# Patient Record
Sex: Female | Born: 1980 | Race: White | Hispanic: No | Marital: Married | Smoking: Former smoker
Health system: Southern US, Community
[De-identification: ages and names within clinical notes are randomized; demographics above are authoritative.]

## PROBLEM LIST (undated history)

## (undated) DIAGNOSIS — Z86018 Personal history of other benign neoplasm: Secondary | ICD-10-CM

## (undated) DIAGNOSIS — Z8719 Personal history of other diseases of the digestive system: Secondary | ICD-10-CM

## (undated) DIAGNOSIS — I471 Supraventricular tachycardia, unspecified: Secondary | ICD-10-CM

## (undated) DIAGNOSIS — Z8619 Personal history of other infectious and parasitic diseases: Secondary | ICD-10-CM

## (undated) DIAGNOSIS — F419 Anxiety disorder, unspecified: Secondary | ICD-10-CM

## (undated) DIAGNOSIS — Z8679 Personal history of other diseases of the circulatory system: Secondary | ICD-10-CM

## (undated) DIAGNOSIS — Z8639 Personal history of other endocrine, nutritional and metabolic disease: Secondary | ICD-10-CM

## (undated) DIAGNOSIS — Z8709 Personal history of other diseases of the respiratory system: Secondary | ICD-10-CM

## (undated) DIAGNOSIS — E041 Nontoxic single thyroid nodule: Secondary | ICD-10-CM

## (undated) HISTORY — DX: Supraventricular tachycardia: I47.1

## (undated) HISTORY — DX: Personal history of other endocrine, nutritional and metabolic disease: Z86.39

## (undated) HISTORY — PX: CARDIAC ELECTROPHYSIOLOGY STUDY AND ABLATION: SHX1294

## (undated) HISTORY — PX: WISDOM TOOTH EXTRACTION: SHX21

## (undated) HISTORY — DX: Nontoxic single thyroid nodule: E04.1

## (undated) HISTORY — DX: Personal history of other diseases of the respiratory system: Z87.09

## (undated) HISTORY — DX: Personal history of other diseases of the circulatory system: Z86.79

## (undated) HISTORY — PX: TONSILLECTOMY AND ADENOIDECTOMY: SUR1326

## (undated) HISTORY — DX: Anxiety disorder, unspecified: F41.9

## (undated) HISTORY — DX: Supraventricular tachycardia, unspecified: I47.10

## (undated) HISTORY — DX: Personal history of other infectious and parasitic diseases: Z86.19

## (undated) HISTORY — DX: Personal history of other diseases of the digestive system: Z87.19

---
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
## 2001-05-24 ENCOUNTER — Emergency Department (HOSPITAL_COMMUNITY): Admission: EM | Admit: 2001-05-24 | Discharge: 2001-05-24 | Payer: Self-pay | Admitting: Emergency Medicine

## 2001-08-01 ENCOUNTER — Emergency Department (HOSPITAL_COMMUNITY): Admission: EM | Admit: 2001-08-01 | Discharge: 2001-08-01 | Payer: Self-pay | Admitting: Emergency Medicine

## 2004-04-11 ENCOUNTER — Other Ambulatory Visit: Admission: RE | Admit: 2004-04-11 | Discharge: 2004-04-11 | Payer: Self-pay | Admitting: Obstetrics and Gynecology

## 2005-04-16 ENCOUNTER — Other Ambulatory Visit: Admission: RE | Admit: 2005-04-16 | Discharge: 2005-04-16 | Payer: Self-pay | Admitting: Obstetrics and Gynecology

## 2010-10-12 ENCOUNTER — Other Ambulatory Visit: Payer: Self-pay | Admitting: Obstetrics and Gynecology

## 2010-10-12 ENCOUNTER — Other Ambulatory Visit (HOSPITAL_COMMUNITY)
Admission: RE | Admit: 2010-10-12 | Discharge: 2010-10-12 | Disposition: A | Discharge status: Home / Self Care | Payer: BC Managed Care – PPO | Source: Ambulatory Visit | Attending: Obstetrics and Gynecology | Admitting: Obstetrics and Gynecology

## 2010-10-12 DIAGNOSIS — Z1159 Encounter for screening for other viral diseases: Secondary | ICD-10-CM | POA: Insufficient documentation

## 2010-10-12 DIAGNOSIS — Z01419 Encounter for gynecological examination (general) (routine) without abnormal findings: Secondary | ICD-10-CM | POA: Insufficient documentation

## 2011-10-01 ENCOUNTER — Other Ambulatory Visit: Payer: Self-pay | Admitting: Obstetrics and Gynecology

## 2011-10-01 ENCOUNTER — Other Ambulatory Visit (HOSPITAL_COMMUNITY)
Admission: RE | Admit: 2011-10-01 | Discharge: 2011-10-01 | Disposition: A | Discharge status: Home / Self Care | Payer: BC Managed Care – PPO | Source: Ambulatory Visit | Attending: Obstetrics and Gynecology | Admitting: Obstetrics and Gynecology

## 2011-10-01 DIAGNOSIS — Z1151 Encounter for screening for human papillomavirus (HPV): Secondary | ICD-10-CM | POA: Insufficient documentation

## 2011-10-01 DIAGNOSIS — Z01419 Encounter for gynecological examination (general) (routine) without abnormal findings: Secondary | ICD-10-CM | POA: Insufficient documentation

## 2011-10-01 DIAGNOSIS — Z113 Encounter for screening for infections with a predominantly sexual mode of transmission: Secondary | ICD-10-CM | POA: Insufficient documentation

## 2011-10-02 ENCOUNTER — Other Ambulatory Visit: Payer: Self-pay | Admitting: Obstetrics and Gynecology

## 2011-10-02 DIAGNOSIS — E049 Nontoxic goiter, unspecified: Secondary | ICD-10-CM

## 2011-10-04 ENCOUNTER — Ambulatory Visit
Admission: RE | Admit: 2011-10-04 | Discharge: 2011-10-04 | Disposition: A | Discharge status: Home / Self Care | Payer: BC Managed Care – PPO | Source: Ambulatory Visit | Attending: Obstetrics and Gynecology | Admitting: Obstetrics and Gynecology

## 2011-10-04 ENCOUNTER — Other Ambulatory Visit: Payer: BC Managed Care – PPO

## 2011-10-04 DIAGNOSIS — E049 Nontoxic goiter, unspecified: Secondary | ICD-10-CM

## 2011-10-05 ENCOUNTER — Other Ambulatory Visit: Payer: Self-pay | Admitting: Obstetrics and Gynecology

## 2011-10-05 DIAGNOSIS — E041 Nontoxic single thyroid nodule: Secondary | ICD-10-CM

## 2011-10-09 ENCOUNTER — Ambulatory Visit
Admission: RE | Admit: 2011-10-09 | Discharge: 2011-10-09 | Disposition: A | Discharge status: Home / Self Care | Payer: BC Managed Care – PPO | Source: Ambulatory Visit | Attending: Obstetrics and Gynecology | Admitting: Obstetrics and Gynecology

## 2011-10-09 ENCOUNTER — Other Ambulatory Visit (HOSPITAL_COMMUNITY)
Admission: RE | Admit: 2011-10-09 | Discharge: 2011-10-09 | Disposition: A | Discharge status: Home / Self Care | Payer: BC Managed Care – PPO | Source: Ambulatory Visit | Attending: Interventional Radiology | Admitting: Interventional Radiology

## 2011-10-09 DIAGNOSIS — E049 Nontoxic goiter, unspecified: Secondary | ICD-10-CM | POA: Insufficient documentation

## 2011-10-09 DIAGNOSIS — E041 Nontoxic single thyroid nodule: Secondary | ICD-10-CM

## 2012-02-11 ENCOUNTER — Other Ambulatory Visit: Payer: Self-pay | Admitting: Obstetrics and Gynecology

## 2012-02-11 DIAGNOSIS — Z8639 Personal history of other endocrine, nutritional and metabolic disease: Secondary | ICD-10-CM

## 2012-02-13 ENCOUNTER — Other Ambulatory Visit: Payer: BC Managed Care – PPO

## 2012-02-26 ENCOUNTER — Ambulatory Visit
Admission: RE | Admit: 2012-02-26 | Discharge: 2012-02-26 | Disposition: A | Discharge status: Home / Self Care | Payer: BC Managed Care – PPO | Source: Ambulatory Visit | Attending: Obstetrics and Gynecology | Admitting: Obstetrics and Gynecology

## 2012-02-26 DIAGNOSIS — Z8639 Personal history of other endocrine, nutritional and metabolic disease: Secondary | ICD-10-CM

## 2013-02-18 ENCOUNTER — Other Ambulatory Visit: Payer: Self-pay | Admitting: Obstetrics and Gynecology

## 2013-02-18 DIAGNOSIS — E041 Nontoxic single thyroid nodule: Secondary | ICD-10-CM

## 2013-02-20 ENCOUNTER — Ambulatory Visit
Admission: RE | Admit: 2013-02-20 | Discharge: 2013-02-20 | Disposition: A | Discharge status: Home / Self Care | Payer: BC Managed Care – PPO | Source: Ambulatory Visit | Attending: Obstetrics and Gynecology | Admitting: Obstetrics and Gynecology

## 2013-02-20 DIAGNOSIS — E041 Nontoxic single thyroid nodule: Secondary | ICD-10-CM

## 2013-06-29 IMAGING — US US THYROID BIOPSY
1 series · 12 of 12 positions shown · non-contrast
Comparison: none

INDICATION: Indeterminate left-sided thyroid nodule

[Series 1: us thyroid biopsy · 0.05mm/px · 12 acquisitions, 12 frames shown]
[im 1/12]
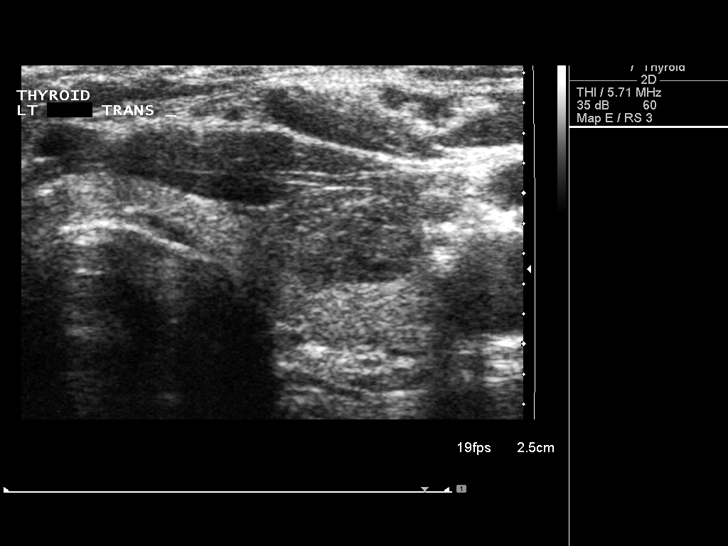
[im 2/12]
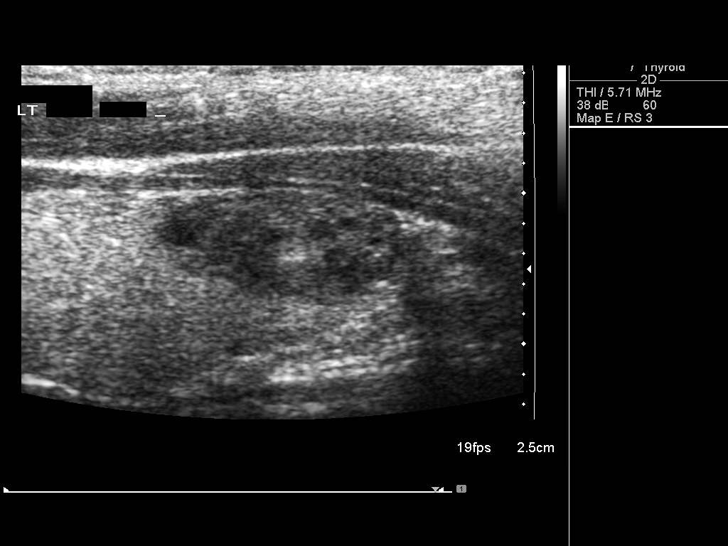
[im 3/12]
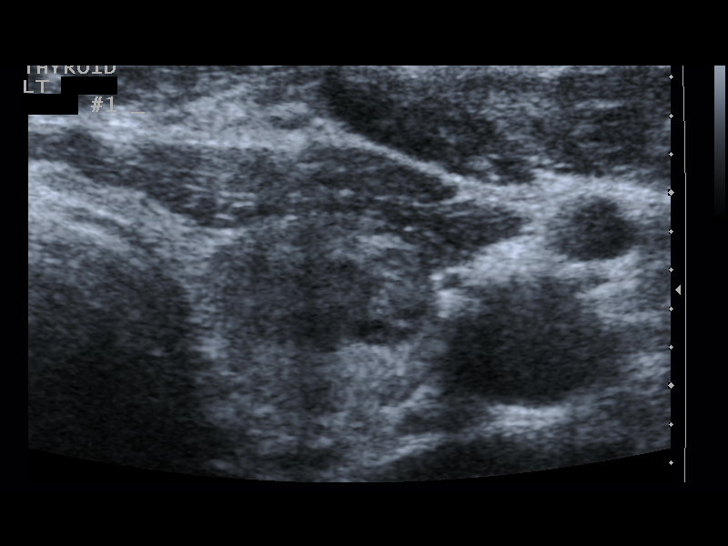
[im 4/12]
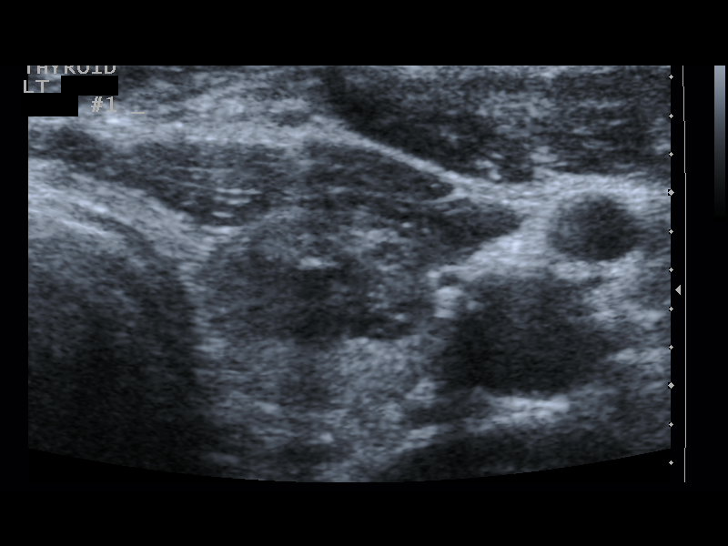
[im 5/12]
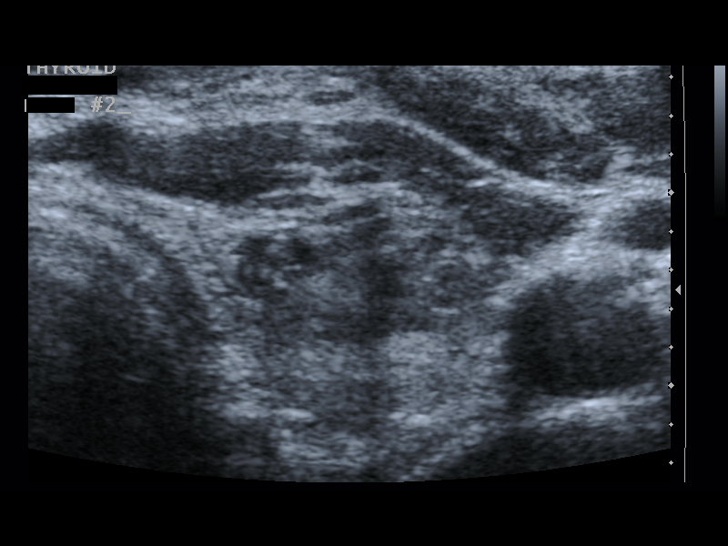
[im 6/12]
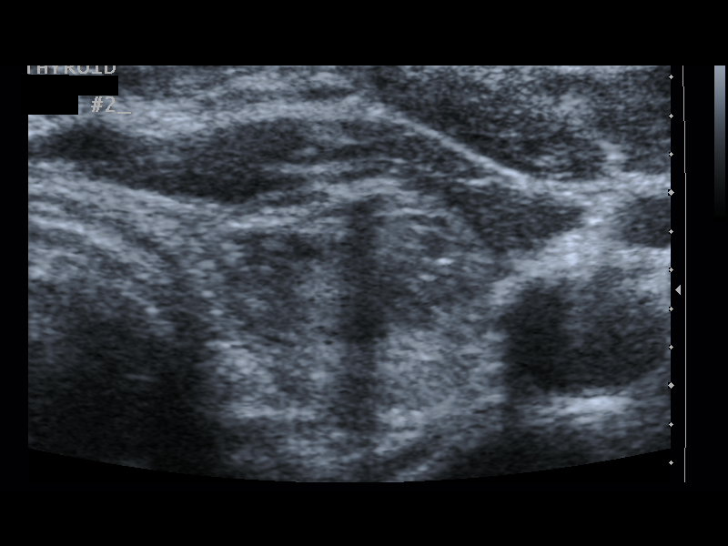
[im 7/12]
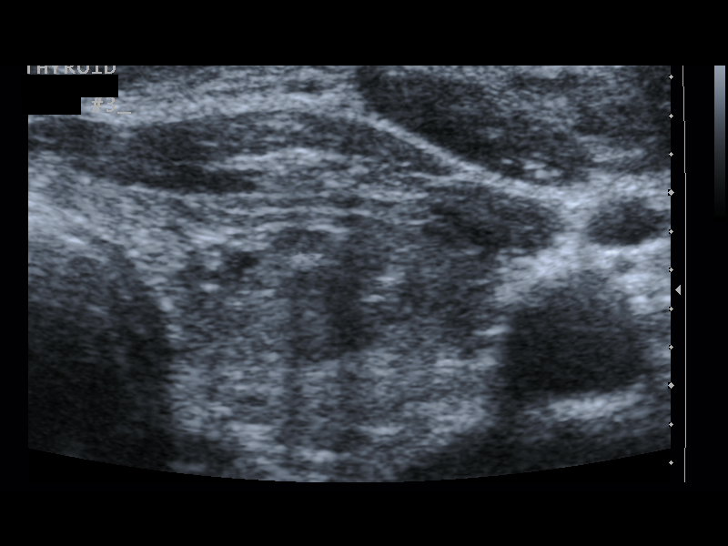
[im 8/12]
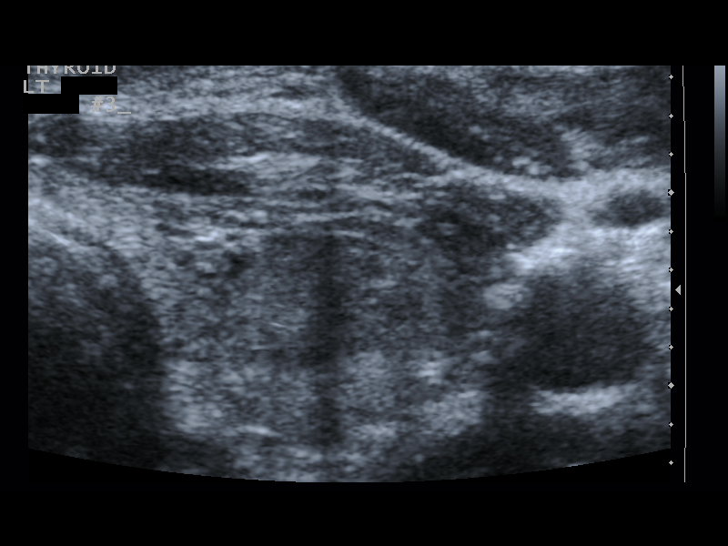
[im 9/12]
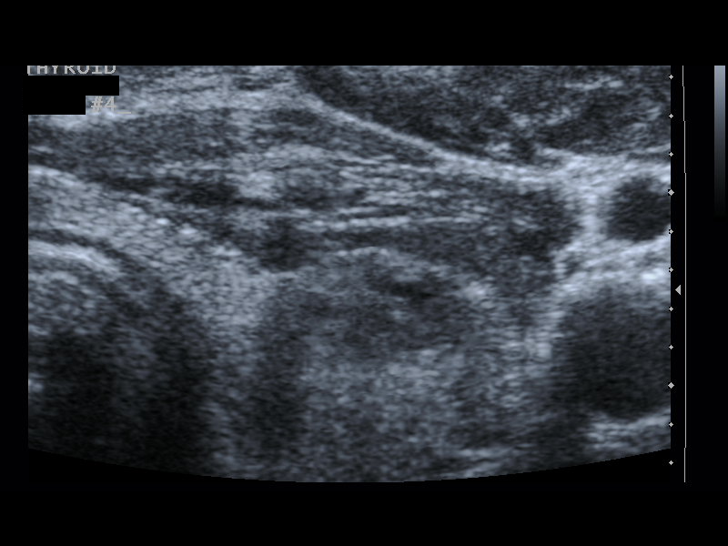
[im 10/12]
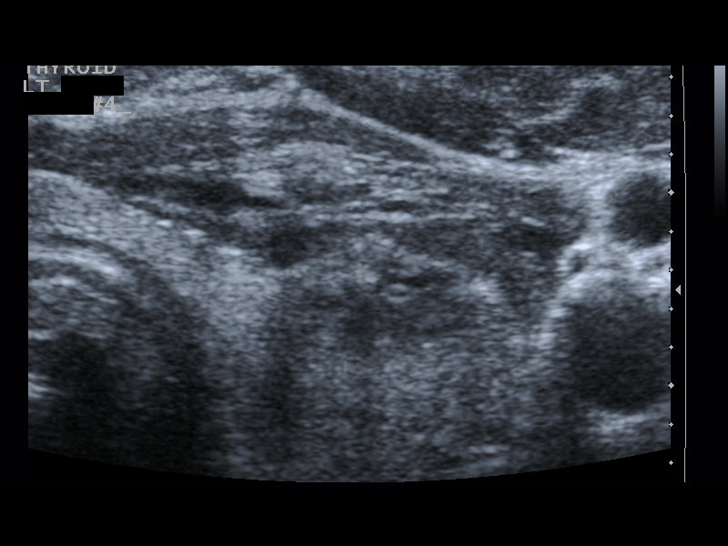
[im 11/12]
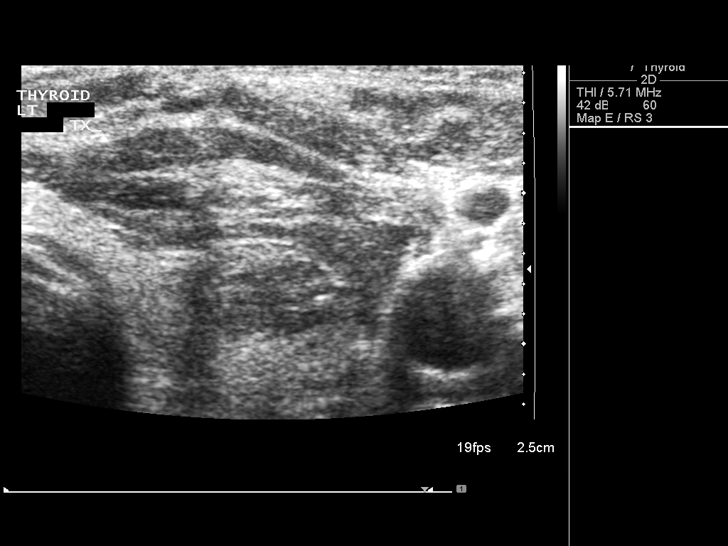
[im 12/12]
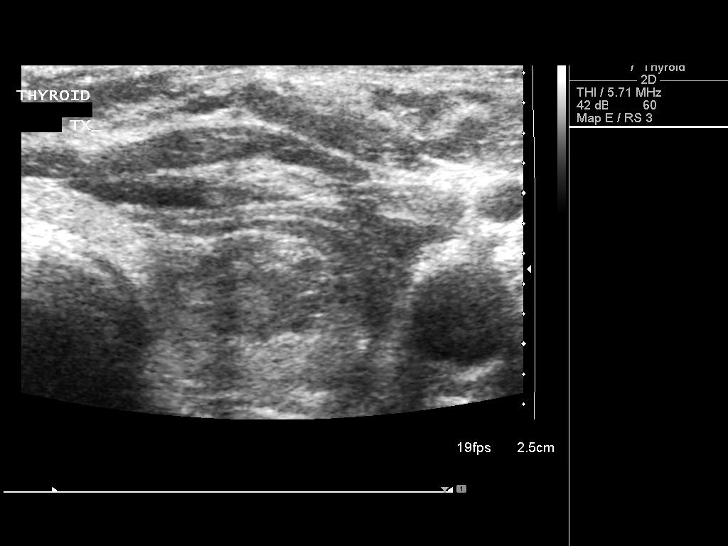

[12 of 12 positions shown; findings below may reference images not displayed]

ULTRASOUND GUIDED THYROID FINE NEEDLE ASPIRATION

Comparisons: Thyroid Ultrasound - 10/04/2011

Intravenous Medications: None

Complications: None immediate

Technique / Findings:

Informed written consent was obtained from the patient after a
discussion of the risks, benefits and alternatives to treatment.
Questions regarding the procedure were encouraged and answered.  A
timeout was performed prior to the initiation of the procedure.

Pre-procedural ultrasound scanning demonstrated grossly unchanged
appearance of primarily hypoechoic approximately 1.6 cm nodule
within the inferior aspect of the left lobe of the thyroid  The
procedure was planned.  The neck was prepped in the usual sterile
fashion, and a sterile drape was applied covering the operative
field.  A timeout was performed prior to the initiation of the
procedure.  Local anesthesia was provided with 1% lidocaine.

Under direct ultrasound guidance, 4 FNA biopsies were performed of
nodule with a 25 gauge needle.  The samples were prepared and
submitted to pathology.  Limited post procedural scanning was
negative for hematoma or additional complication.  A dressing was
placed.  The patient tolerated procedure well without immediate
postprocedural complication.
IMPRESSION: Technically successful ultrasound guided fine needle aspiration of
indeterminate nodule within the inferior aspect of the left lobe of
the thyroid

## 2013-10-15 ENCOUNTER — Other Ambulatory Visit: Payer: Self-pay | Admitting: Obstetrics and Gynecology

## 2013-10-15 ENCOUNTER — Other Ambulatory Visit (HOSPITAL_COMMUNITY)
Admission: RE | Admit: 2013-10-15 | Discharge: 2013-10-15 | Disposition: A | Discharge status: Home / Self Care | Payer: BC Managed Care – PPO | Source: Ambulatory Visit | Attending: Obstetrics and Gynecology | Admitting: Obstetrics and Gynecology

## 2013-10-15 DIAGNOSIS — Z1151 Encounter for screening for human papillomavirus (HPV): Secondary | ICD-10-CM | POA: Insufficient documentation

## 2013-10-15 DIAGNOSIS — Z01419 Encounter for gynecological examination (general) (routine) without abnormal findings: Secondary | ICD-10-CM | POA: Insufficient documentation

## 2013-10-20 LAB — CYTOLOGY - PAP

## 2013-11-30 ENCOUNTER — Encounter: Payer: Self-pay | Admitting: Family Medicine

## 2013-11-30 ENCOUNTER — Encounter (INDEPENDENT_AMBULATORY_CARE_PROVIDER_SITE_OTHER): Payer: Self-pay

## 2013-11-30 ENCOUNTER — Ambulatory Visit (INDEPENDENT_AMBULATORY_CARE_PROVIDER_SITE_OTHER): Payer: BC Managed Care – PPO | Admitting: Family Medicine

## 2013-11-30 VITALS — BP 122/66 | HR 70 | Temp 98.8°F | Ht 65.0 in | Wt 148.0 lb

## 2013-11-30 DIAGNOSIS — E041 Nontoxic single thyroid nodule: Secondary | ICD-10-CM

## 2013-11-30 DIAGNOSIS — K219 Gastro-esophageal reflux disease without esophagitis: Secondary | ICD-10-CM

## 2013-11-30 DIAGNOSIS — R590 Localized enlarged lymph nodes: Secondary | ICD-10-CM | POA: Insufficient documentation

## 2013-11-30 DIAGNOSIS — I471 Supraventricular tachycardia: Secondary | ICD-10-CM | POA: Insufficient documentation

## 2013-11-30 DIAGNOSIS — Z91030 Bee allergy status: Secondary | ICD-10-CM | POA: Insufficient documentation

## 2013-11-30 DIAGNOSIS — J302 Other seasonal allergic rhinitis: Secondary | ICD-10-CM | POA: Insufficient documentation

## 2013-11-30 MED ORDER — EPINEPHRINE 0.3 MG/0.3ML IJ SOAJ: 0.3000 mg | Freq: Once | INTRAMUSCULAR | Status: DC

## 2013-11-30 NOTE — Assessment & Plan Note (Signed)
Palpable small LN L submandibular Unsure cause -will watch this  If not down/ nl in 1-2 weeks pt will update   No other adenopathy

## 2013-11-30 NOTE — Patient Instructions (Signed)
Please stop up front to request records  Take care of yourself  In allergy season -try claritin and flonase nasal spray

## 2013-11-30 NOTE — Assessment & Plan Note (Signed)
Stable by recent thyroid US Neg bx in the past  Will continue to watch it

## 2013-11-30 NOTE — Assessment & Plan Note (Signed)
Given epi pen px

## 2013-11-30 NOTE — Assessment & Plan Note (Signed)
Resolved s/p ablation  Disc avoidance of caffeine  No M heard on exam today

## 2013-11-30 NOTE — Progress Notes (Signed)
Pre visit review using our clinic review tool, if applicable. No additional management support is needed unless otherwise documented below in the visit note. 

## 2013-11-30 NOTE — Assessment & Plan Note (Signed)
Springtime symptoms - suspect tree pollen allergies Will try clartin daily and flonase ns as needed during season Nl exam today

## 2013-11-30 NOTE — Assessment & Plan Note (Signed)
This does not bother her if she eats properly /avoiding spicy foods and does not eat late at night  Disc lifestyle change and diet for GERD  If symptoms develop regularly -would discuss daily tx  Will use H2 blocker prn for now

## 2013-11-30 NOTE — Progress Notes (Signed)
Subjective:    Patient ID: Donna FasterJustine Contreras, female    DOB: 1980-10-30, 33 y.o.   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 Whether she had a murmur  She does have to watch caffeine or heart will "flutter"   ? 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  Past Medical History  Diagnosis Date  . History of chicken pox   . History of gastroesophageal reflux (GERD)   . History of hay fever   . History of cardiac murmur   . History of thyroid disease    Past Surgical History  Procedure Laterality Date  . Wisdom tooth extraction    . Cardiac electrophysiology study and ablation    . Tonsillectomy and adenoidectomy     History  Substance Use Topics  . Smoking status: Former Games developermoker  . Smokeless tobacco: Never Used  . Alcohol Use: No     Comment:  socially-weekends   Family History  Problem Relation Age of Onset  . Arthritis Maternal Grandmother   . Heart attack Maternal Grandfather   . Stroke Paternal Grandmother   . Diabetes Father   . Skin cancer Paternal Grandfather    Allergies  Allergen Reactions  . Bee Venom Anaphylaxis   No current outpatient prescriptions on file prior to visit.   No current facility-administered medications on file prior to visit.      Review of Systems Review of Systems  Constitutional: Negative for fever, appetite change, fatigue and unexpected weight change.  Eyes: Negative for pain and visual disturbance.  Respiratory: Negative for cough and shortness of breath.   Cardiovascular: Negative for cp or palpitations    Gastrointestinal: Negative for nausea, diarrhea and constipation.  Genitourinary: Negative for urgency and frequency.  Skin: Negative for pallor or rash   Neurological: Negative for weakness, light-headedness, numbness and headaches.  Hematological: Negative for adenopathy. Does not bruise/bleed easily.  Psychiatric/Behavioral: Negative for dysphoric mood. The patient is not nervous/anxious.         Objective:   Physical Exam  Constitutional: She appears  well-developed and well-nourished. No distress.  HENT:  Head: Normocephalic and atraumatic.  Right Ear: External ear normal.  Left Ear: External ear normal.  Nose: Nose normal.  Mouth/Throat: Oropharynx is clear and moist.  Eyes: Conjunctivae and EOM are normal. Pupils are equal, round, and reactive to light. Right eye exhibits no discharge. Left eye exhibits no discharge. No scleral icterus.  Neck: Normal range of motion. Neck supple. No JVD present. No thyromegaly present.  Small mobile LN - L submandibular No tenderness   Cardiovascular: Normal rate, regular rhythm, normal heart sounds and intact distal pulses.  Exam reveals no gallop.   Pulmonary/Chest: Effort normal and breath sounds normal. No respiratory distress.  She has no wheezes. She has no rales.  Abdominal: Soft. Bowel sounds are normal. She exhibits no distension and no mass. There is no tenderness.  Musculoskeletal: She exhibits no edema or tenderness.  Lymphadenopathy:    She has cervical adenopathy.  Neurological: She is alert. She has normal reflexes. No cranial nerve deficit. She exhibits normal muscle tone. Coordination normal.  Skin: Skin is warm and dry. No rash noted. No erythema. No pallor.  Psychiatric: She has a normal mood and affect.          Assessment & Plan:   Problem List Items Addressed This Visit      Cardiovascular and Mediastinum   SVT (supraventricular tachycardia)    Resolved s/p ablation  Disc avoidance of caffeine  No M heard on exam today    Relevant Medications      EPINEPHrine 0.3 mg/0.3 mL IJ SOAJ injection     Respiratory   Seasonal allergic rhinitis    Springtime symptoms - suspect tree pollen allergies Will try clartin daily and flonase ns as needed during season Nl exam today      Digestive   GERD (gastroesophageal reflux disease)    This does not bother her if she eats properly /avoiding spicy foods and does not eat late at night  Disc lifestyle change and diet for GERD  If symptoms develop regularly -would discuss daily tx  Will use H2 blocker prn for now       Endocrine   Left thyroid nodule - Primary    Stable by recent thyroid US Neg bx in the past  Will continue to watch it       Immune and Lymphatic   Submandibular lymphadenopathy    Palpable small LN L submandibular Unsure cause -will watch this  If not down/ nl in 1-2 weeks pt will update   No other adenopathy      Other   H/O bee sting allergy    Given epi pen px

## 2014-01-22 DIAGNOSIS — Z86018 Personal history of other benign neoplasm: Secondary | ICD-10-CM

## 2014-01-22 HISTORY — DX: Personal history of other benign neoplasm: Z86.018

## 2014-03-08 ENCOUNTER — Encounter: Payer: Self-pay | Admitting: Family Medicine

## 2014-03-08 ENCOUNTER — Ambulatory Visit (INDEPENDENT_AMBULATORY_CARE_PROVIDER_SITE_OTHER): Payer: BC Managed Care – PPO | Admitting: Family Medicine

## 2014-03-08 VITALS — BP 112/72 | HR 72 | Temp 98.3°F | Ht 65.0 in | Wt 155.8 lb

## 2014-03-08 DIAGNOSIS — L298 Other pruritus: Secondary | ICD-10-CM

## 2014-03-08 DIAGNOSIS — N898 Other specified noninflammatory disorders of vagina: Secondary | ICD-10-CM

## 2014-03-08 DIAGNOSIS — R300 Dysuria: Secondary | ICD-10-CM

## 2014-03-08 LAB — POCT WET PREP (WET MOUNT): KOH Wet Prep POC: POSITIVE

## 2014-03-08 LAB — POCT URINALYSIS DIPSTICK
Bilirubin, UA: NEGATIVE
Glucose, UA: NEGATIVE
KETONES UA: NEGATIVE
Nitrite, UA: NEGATIVE
RBC UA: 2
Spec Grav, UA: >=1.03
UROBILINOGEN UA: 0.2
pH, UA: 5.5

## 2014-03-08 MED ORDER — FLUCONAZOLE 150 MG PO TABS
ORAL_TABLET | ORAL | Status: DC
Start: 2014-03-08 — End: 2017-04-11

## 2014-03-08 NOTE — Patient Instructions (Signed)
Take diflucan as directed  You can use the nystatin externally if needed  Follow up with gyn if not improving  Taking a probiotic over the counter may also help

## 2014-03-08 NOTE — Assessment & Plan Note (Signed)
With yeast on wet prep- per pt recurrent  She does not tolerate most topicals-they burn  Has disc with her gyn  Disc poss use of probiotics or something vaginal to change PH tx with diflucan today - 1 now and 1 in 3 d (150 mg)   She can continue nystatin externally for comfort  Update if not starting to improve in a week or if worsening

## 2014-03-08 NOTE — Progress Notes (Signed)
Pre visit review using our clinic review tool, if applicable. No additional management support is needed unless otherwise documented below in the visit note. 

## 2014-03-08 NOTE — Progress Notes (Signed)
Subjective:    Patient ID: Donna FasterJustine Contreras, female    DOB: 25-Feb-1980, 34 y.o.   MRN: [id number]  HPI Here with vaginal infection   Some itching - similar to past yeast infections ? If d/c - just ended menses  These tend to come after her period Feels swollen and very irritated   Cannot use otc creams- make her worse- ? Allergic  Has been given nystatin - does not work well  Given a gel in the past - UC gave her a gel ? -not sure what it was (for yeast)   Diflucan works the best for her   No pelvic pain No susp of STDs at all - and was tested at gyn recently   Last gyn visit was Oct -- pap was ok   Does not need to be on birth control   ua did dip pos for leuk and rbc   Patient Active Problem List   Diagnosis Date Noted  . Left thyroid nodule 11/30/2013  . GERD (gastroesophageal reflux disease) 11/30/2013  . Seasonal allergic rhinitis 11/30/2013  . SVT (supraventricular tachycardia) 11/30/2013  . H/O bee sting allergy 11/30/2013  . Submandibular lymphadenopathy 11/30/2013   Past Medical History  Diagnosis Date  . History of chicken pox   . History of gastroesophageal reflux (GERD)   . History of hay fever   . History of cardiac murmur   . History of thyroid disease   . Thyroid nodule     left  . SVT (supraventricular tachycardia)     as a teen   Past Surgical History  Procedure Laterality Date  . Wisdom tooth extraction    . Cardiac electrophysiology study and ablation    . Tonsillectomy and adenoidectomy     History  Substance Use Topics  . Smoking status: Former Games developermoker  . Smokeless tobacco: Never Used  . Alcohol Use: No     Comment: socially-weekends   Family History  Problem Relation Age of Onset  . Arthritis Maternal Grandmother   . Heart attack Maternal Grandfather   . Stroke Paternal Grandmother   . Diabetes Father   . Skin cancer Paternal Grandfather    Allergies  Allergen Reactions  . Bee Venom Anaphylaxis   Current Outpatient  Prescriptions on File Prior to Visit  Medication Sig Dispense Refill  . EPINEPHrine 0.3 mg/0.3 mL IJ SOAJ injection Inject 0.3 mLs (0.3 mg total) into the muscle once. 1 Device 3   No current facility-administered medications on file prior to visit.    Review of Systems Review of Systems  Constitutional: Negative for fever, appetite change, fatigue and unexpected weight change.  Eyes: Negative for pain and visual disturbance.  Respiratory: Negative for cough and shortness of breath.   Cardiovascular: Negative for cp or palpitations    Gastrointestinal: Negative for nausea, diarrhea and constipation.  Genitourinary: Negative for urgency and frequency. neg for dysuria/hematuria or flank pain pos for intense vaginal itching  Skin: Negative for pallor , pos for red rash on vulva without vesicles  Neurological: Negative for weakness, light-headedness, numbness and headaches.  Hematological: Negative for adenopathy. Does not bruise/bleed easily.  Psychiatric/Behavioral: Negative for dysphoric mood. The patient is not nervous/anxious.         Objective:   Physical Exam  Constitutional: She appears well-developed and well-nourished. No distress.  HENT:  Head: Normocephalic and atraumatic.  Eyes: Conjunctivae and EOM are normal. Pupils are equal, round, and reactive to light.  Neck: Normal range of motion. Neck  supple.  Cardiovascular: Normal rate and regular rhythm.   Abdominal: Soft. Bowel sounds are normal. She exhibits no distension. There is no tenderness. There is no rebound.  No suprapubic tenderness or fullness    Genitourinary:  Erythema of labia minora and majora without skin breakdown  Scant vaginal d/c-pale    Neurological: She is alert.  Skin: Skin is warm and dry. There is erythema.  Psychiatric: She has a normal mood and affect.          Assessment & Plan:   Problem List Items Addressed This Visit      Musculoskeletal and Integument   Vaginal itching - Primary      With yeast on wet prep- per pt recurrent  She does not tolerate most topicals-they burn  Has disc with her gyn  Disc poss use of probiotics or something vaginal to change PH tx with diflucan today - 1 now and 1 in 3 d (150 mg)   She can continue nystatin externally for comfort  Update if not starting to improve in a week or if worsening        Relevant Orders   POCT Wet Prep [facility]) (Completed)    Other Visit Diagnoses    Burning with urination        Relevant Orders    POCT urinalysis dipstick (Completed)

## 2014-11-24 ENCOUNTER — Other Ambulatory Visit (HOSPITAL_COMMUNITY)
Admission: RE | Admit: 2014-11-24 | Discharge: 2014-11-24 | Disposition: A | Discharge status: Home / Self Care | Payer: BC Managed Care – PPO | Source: Ambulatory Visit | Attending: Obstetrics and Gynecology | Admitting: Obstetrics and Gynecology

## 2014-11-24 ENCOUNTER — Other Ambulatory Visit: Payer: Self-pay | Admitting: Obstetrics and Gynecology

## 2014-11-24 DIAGNOSIS — Z01419 Encounter for gynecological examination (general) (routine) without abnormal findings: Secondary | ICD-10-CM | POA: Insufficient documentation

## 2014-11-26 LAB — CYTOLOGY - PAP

## 2015-01-04 ENCOUNTER — Other Ambulatory Visit: Payer: Self-pay | Admitting: Obstetrics and Gynecology

## 2016-03-26 ENCOUNTER — Encounter: Payer: Self-pay | Admitting: Family Medicine

## 2016-04-27 ENCOUNTER — Encounter: Payer: Self-pay | Admitting: Family Medicine

## 2016-04-28 LAB — HM HIV SCREENING LAB: HM HIV Screening: NEGATIVE

## 2016-04-28 LAB — HM HEPATITIS C SCREENING LAB: HM HEPATITIS C SCREENING: NEGATIVE

## 2016-04-28 LAB — TSH: TSH: 1.82 (ref 0.41–5.90)

## 2016-08-03 ENCOUNTER — Telehealth: Payer: Self-pay

## 2016-08-03 NOTE — Telephone Encounter (Signed)
Pt wants date of last tdap; do not see immunizations from another office. Pt will ck with [facility].

## 2016-12-19 ENCOUNTER — Other Ambulatory Visit: Payer: Self-pay | Admitting: Obstetrics and Gynecology

## 2016-12-19 ENCOUNTER — Other Ambulatory Visit (HOSPITAL_COMMUNITY)
Admission: RE | Admit: 2016-12-19 | Discharge: 2016-12-19 | Disposition: A | Discharge status: Home / Self Care | Payer: BC Managed Care – PPO | Source: Ambulatory Visit | Attending: Obstetrics and Gynecology | Admitting: Obstetrics and Gynecology

## 2016-12-19 DIAGNOSIS — Z124 Encounter for screening for malignant neoplasm of cervix: Secondary | ICD-10-CM | POA: Diagnosis not present

## 2016-12-23 LAB — CYTOLOGY - PAP
DIAGNOSIS: NEGATIVE
HPV: NOT DETECTED

## 2017-04-11 ENCOUNTER — Ambulatory Visit: Payer: BC Managed Care – PPO | Admitting: Family Medicine

## 2017-04-11 ENCOUNTER — Encounter: Payer: Self-pay | Admitting: Family Medicine

## 2017-04-11 VITALS — BP 121/80 | HR 98 | Ht 65.0 in | Wt 160.6 lb

## 2017-04-11 DIAGNOSIS — E663 Overweight: Secondary | ICD-10-CM

## 2017-04-11 DIAGNOSIS — E559 Vitamin D deficiency, unspecified: Secondary | ICD-10-CM

## 2017-04-11 DIAGNOSIS — J3089 Other allergic rhinitis: Secondary | ICD-10-CM

## 2017-04-11 DIAGNOSIS — Z719 Counseling, unspecified: Secondary | ICD-10-CM | POA: Diagnosis not present

## 2017-04-11 NOTE — Progress Notes (Signed)
New patient office visit note:  Impression and Recommendations:    1. Overweight (BMI 25.0-29.9)   2. Health education/counseling   3. h/o Vitamin D deficiency   4. Environmental and seasonal allergies      Education and routine counseling performed. Handouts provided.  1. Health Maintenance Explained to patient what BMI refers to, and what it means medically.    Told patient to think about it as a "medical risk stratification measurement" and how increasing BMI is associated with increasing risk/ or worsening state of various diseases such as hypertension, hyperlipidemia, diabetes, premature OA, depression etc.  American Heart Association guidelines for healthy diet, basically Mediterranean diet, and exercise guidelines of 30 minutes 5 days per week or more discussed in detail.  Health counseling performed.  All questions answered.  - Advised patient to continue working toward exercising to improve health.    - Patient may begin with 15 minutes of activity daily.  Recommended that the patient eventually strive for at least 150 minutes of cardio per week according to guidelines established by the [facility].   - Healthy dietary habits encouraged, including low-carb, and high amounts of lean protein in diet.   - Patient should also consume adequate amounts of water - half of body weight in oz of water per day.  - Reviewed that exercise and water are so important for stress relief. - Reviewed the importance of adequate blood flow to increase fertility and organ health.  2. Follow-Up - Patient will bring her lab records in next appointment. - Reviewed that we need her labs for baseline.  - Return in 6-12 months for follow-up visit.  Can repeat needed labs at this time.   Gross side effects, risk and benefits, and alternatives of medications discussed with patient.  Patient is aware that all medications have potential side effects and we are unable to predict every side effect or  drug-drug interaction that may occur.  Expresses verbal understanding and consents to current therapy plan and treatment regimen.  Return for 6-12 mo f/up CPE.  Please see AVS handed out to patient at the end of our visit for further patient instructions/ counseling done pertaining to today's office visit.    Note: This document was prepared using Dragon voice recognition software and may include unintentional dictation errors.    This document serves as a record of services personally performed by Thomasene Lot, DO. It was created on her behalf by Peggye Fothergill, a trained medical scribe. The creation of this record is based on the scribe's personal observations and the provider's statements to them.   I have reviewed the above medical documentation for accuracy and completeness and I concur.  Thomasene Lot 04/16/17 8:18 AM    ----------------------------------------------------------------------------------------------------------------------    Subjective:    Chief complaint:   Chief Complaint  Patient presents with  . Establish Care    HPI: Donna Contreras is a pleasant 37 y.o. female who presents to [facility] today to review their medical history with me and establish care.   I asked the patient to review their chronic problem list with me to ensure everything was updated and accurate.    All recent office visits with other providers, any medical records that patient brought in etc  - I reviewed today.     We asked pt to get [place] their medical records from [facility] providers/ specialists that they had seen within the past 3-5 years- if they are  in private practice and/or do not work for [employer], [facility], [place], [facility] or [facility] owned practice.  Told them to call their specialists to clarify this if they are not sure.   Is here today to establish care.  Lives nearby, and coming here is convenient.  Notes that she goes to the OBGYN, but  not regularly to a PCP.  Social History Originally from [place]; around [place].  Married to husband Clide Cliff for 5 years. Has stepson, 13, that lives with her and her husband.  Never had her own kid; working on having a kid now.  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  Works for her [employer] on top of her full-time job.  Tobacco Use Former smoker; quit 5 years ago. Smoked socially when out drinking, 0.25 ppd at most.  EtOH Use Likes to drink beer.  Family History Grandfather died in 88's, 78's of recurrent melanoma. Grandmother died of a stroke (on her biological dad's side). Biological dad died of heart or diabetes. Biological dad likely had type I diabetes.  Surgical History SVT at age 86. Had cardiac ablation at age 74. [facility].  Past Medical History  Notes that she goes to the OBGYN and never goes to the doctor.  Cardiac History of heart issues when younger.  Had ablation done at age 61. Has never had any heart disease per se. Every now and then, her heart will still flutter.  Is able to be active now without symptoms.  OBGYN Regularly sees OBGYN - Geryl Rankins. Hopefully going to [facility] to start IVF this summer. Her husband had a vasectomy, then had a reversal but this didn't work.  Notes that she recently had work done through [employer] and [employer].  Seasonal Allergies Has allergies in the spring. Takes allergy meds (claritin tablet) daily OTC Doesn't do sinus rinses or flonase.  GERD Takes OTC zantac (ranitidine) for GERD.  Health Habits Does not exercise regularly because she works so much.   Wt Readings from Last 3 Encounters:  04/11/17 160 lb 9.6 oz (72.8 kg)  03/08/14 155 lb 12.8 oz (70.7 kg)  11/30/13 148 lb (67.1 kg)   BP Readings from Last 3  Encounters:  04/11/17 121/80  03/08/14 112/72  11/30/13 122/66   Pulse Readings from Last 3 Encounters:  04/11/17 98  03/08/14 72  11/30/13 70   BMI Readings from Last 3 Encounters:  04/11/17 26.73 kg/m  03/08/14 25.93 kg/m  11/30/13 24.63 kg/m    Patient Care Team    Relationship Specialty Notifications Start End  Thomasene Lot, DO PCP - General Family Medicine  04/11/17     Patient Active Problem List   Diagnosis Date Noted  . Overweight (BMI 25.0-29.9) 04/16/2017  . h/o Vitamin D deficiency 04/16/2017  . Environmental and seasonal allergies 04/16/2017  . Vaginal itching 03/08/2014  . Left thyroid nodule 11/30/2013  . GERD (gastroesophageal reflux disease) 11/30/2013  . Seasonal allergic rhinitis 11/30/2013  . SVT (supraventricular tachycardia) (HCC) 11/30/2013  . H/O bee sting allergy 11/30/2013  . Submandibular lymphadenopathy 11/30/2013     Past Medical History:  Diagnosis Date  . History of cardiac murmur   . History of chicken pox   . History of gastroesophageal reflux (GERD)   . History of hay fever   . History of thyroid disease   . SVT (  supraventricular tachycardia) (HCC)    as a teen  . Thyroid nodule    left     Past Medical History:  Diagnosis Date  . History of cardiac murmur   . History of chicken pox   . History of gastroesophageal reflux (GERD)   . History of hay fever   . History of thyroid disease   . SVT (supraventricular tachycardia) (HCC)    as a teen  . Thyroid nodule    left    Family History  Problem Relation Age of Onset  . Heart attack Maternal Grandfather   . Arthritis Maternal Grandmother   . Stroke Paternal Grandmother   . Diabetes Father   . Skin cancer Paternal Grandfather      Social History   Substance and Sexual Activity  Drug Use No     Social History   Substance and Sexual Activity  Alcohol Use Yes  . Alcohol/week: 3.0 oz  . Types: 5 Standard drinks or equivalent per week   Comment:  socially-weekends     Social History   Tobacco Use  Smoking Status Former Smoker  . Packs/day: 0.25  . Last attempt to quit: 2014  . Years since quitting: 5.2  Smokeless Tobacco Never Used     Current Meds  Medication Sig  . EPINEPHrine (EPIPEN IJ) Inject as directed. As needed  . Fexofenadine HCl (ALLERGY 24-HR PO) Take 1 tablet by mouth daily.  . ranitidine (ZANTAC) 150 MG tablet Take 150 mg by mouth 2 (two) times daily.    Allergies: Bee venom   Review of Systems  Constitutional: Negative for chills, diaphoresis, fever, malaise/fatigue and weight loss.  HENT: Negative for congestion, sore throat and tinnitus.   Eyes: Negative for blurred vision, double vision and photophobia.  Respiratory: Negative for cough and wheezing.   Cardiovascular: Negative for chest pain and palpitations.  Gastrointestinal: Negative for blood in stool, diarrhea, nausea and vomiting.  Genitourinary: Negative for dysuria, frequency and urgency.  Musculoskeletal: Negative for joint pain and myalgias.  Skin: Negative for itching and rash.  Neurological: Negative for dizziness, focal weakness, weakness and headaches.  Endo/Heme/Allergies: Negative for environmental allergies and polydipsia. Does not bruise/bleed easily.  Psychiatric/Behavioral: Negative for depression and memory loss. The patient is not nervous/anxious and does not have insomnia.      Objective:   Blood pressure 121/80, pulse 98, height 5\' 5"  (1.651 m), weight 160 lb 9.6 oz (72.8 kg), last menstrual period 03/21/2017, SpO2 98 %. Body mass index is 26.73 kg/m. General: Well Developed, well nourished, and in no acute distress.  Neuro: Alert and oriented x3, extra-ocular muscles intact, sensation grossly intact.  HEENT:[place]/AT, PERRLA, neck supple, No carotid bruits Skin: no gross rashes  Cardiac: Regular rate and rhythm Respiratory: Essentially clear to auscultation bilaterally. Not using accessory muscles, speaking in full  sentences.  Abdominal: not grossly distended Musculoskeletal: Ambulates w/o diff, FROM * 4 ext.  Vasc: less 2 sec cap RF, warm and pink  Psych:  No HI/SI, judgement and insight good, Euthymic mood. Full Affect.    No results found for this or any previous visit (from the past 2160 hour(s)).

## 2017-04-11 NOTE — Patient Instructions (Addendum)
-Please bring in your labs that you had done at the OB/GYN's the full set recently.  Please bring them in at your convenience.  You can come in in 6-12 months for a follow-up.    Please realize, EXERCISE IS MEDICINE!  -  American Heart Association [facility]( AHA) guidelines for exercise : If you are in good health, without any medical conditions, you should engage in 150 minutes of moderate intensity aerobic activity per week.  This means you should be huffing and puffing throughout your workout.   Engaging in regular exercise will improve brain function and memory, as well as improve mood, boost immune system and help with weight management.  As well as the other, more well-known effects of exercise such as decreasing blood sugar levels, decreasing blood pressure,  and decreasing bad cholesterol levels/ increasing good cholesterol levels.     -  The AHA strongly endorses consumption of a diet that contains a variety of foods from all the food categories with an emphasis on fruits and vegetables; fat-free and low-fat dairy products; cereal and grain products; legumes and nuts; and fish, poultry, and/or extra lean meats.    Excessive food intake, especially of foods high in saturated and trans fats, sugar, and salt, should be avoided.    Adequate water intake of roughly 1/2 of your weight in pounds, should equal the ounces of water per day you should drink.  So for instance, if you're 200 pounds, that would be 100 ounces of water per day.          Mediterranean Diet  Why follow it? Research shows. . Those who follow the Mediterranean diet have a reduced risk of heart disease  . The diet is associated with a reduced incidence of Parkinson's and Alzheimer's diseases . People following the diet may have longer life expectancies and lower rates of chronic diseases  . The Dietary Guidelines for Americans recommends the Mediterranean diet as an eating plan to promote health and prevent disease  What Is the  Mediterranean Diet?  . Healthy eating plan based on typical foods and recipes of Mediterranean-style cooking . The diet is primarily a plant based diet; these foods should make up a majority of meals   Starches - Plant based foods should make up a majority of meals - They are an important sources of vitamins, minerals, energy, antioxidants, and fiber - Choose whole grains, foods high in fiber and minimally processed items  - Typical grain sources include wheat, oats, barley, corn, brown rice, bulgar, farro, millet, polenta, couscous  - Various types of beans include chickpeas, lentils, fava beans, black beans, white beans   Fruits  Veggies - Large quantities of antioxidant rich fruits & veggies; 6 or more servings  - Vegetables can be eaten raw or lightly drizzled with oil and cooked  - Vegetables common to the traditional Mediterranean Diet include: artichokes, arugula, beets, broccoli, brussel sprouts, cabbage, carrots, celery, collard greens, cucumbers, eggplant, kale, leeks, lemons, lettuce, mushrooms, okra, onions, peas, peppers, potatoes, pumpkin, radishes, rutabaga, shallots, spinach, sweet potatoes, turnips, zucchini - Fruits common to the Mediterranean Diet include: apples, apricots, avocados, cherries, clementines, dates, figs, grapefruits, grapes, melons, nectarines, oranges, peaches, pears, pomegranates, strawberries, tangerines  Fats - Replace butter and margarine with healthy oils, such as olive oil, canola oil, and tahini  - Limit nuts to no more than a handful a day  - Nuts include walnuts, almonds, pecans, pistachios, pine nuts  - Limit or avoid candied, honey roasted or  heavily salted nuts - Olives are central to the Mediterranean diet - can be eaten whole or used in a variety of dishes   Meats Protein - Limiting red meat: no more than a few times a month - When eating red meat: choose lean cuts and keep the portion to the size of deck of cards - Eggs: approx. 0 to 4 times a  week  - Fish and lean poultry: at least 2 a week  - Healthy protein sources include, chicken, [place], lean beef, lamb - Increase intake of seafood such as tuna, salmon, trout, mackerel, shrimp, scallops - Avoid or limit high fat processed meats such as sausage and bacon  Dairy - Include moderate amounts of low fat dairy products  - Focus on healthy dairy such as fat free yogurt, skim milk, low or reduced fat cheese - Limit dairy products higher in fat such as whole or 2% milk, cheese, ice cream  Alcohol - Moderate amounts of red wine is ok  - No more than 5 oz daily for women (all ages) and men older than age 3  - No more than 10 oz of wine daily for men younger than 46  Other - Limit sweets and other desserts  - Use herbs and spices instead of salt to flavor foods  - Herbs and spices common to the traditional Mediterranean Diet include: basil, bay leaves, chives, cloves, cumin, fennel, garlic, lavender, marjoram, mint, oregano, parsley, pepper, rosemary, sage, savory, sumac, tarragon, thyme   It's not just a diet, it's a lifestyle:  . The Mediterranean diet includes lifestyle factors typical of those in the region  . Foods, drinks and meals are best eaten with others and savored . Daily physical activity is important for overall good health . This could be strenuous exercise like running and aerobics . This could also be more leisurely activities such as walking, housework, yard-work, or taking the stairs . Moderation is the key; a balanced and healthy diet accommodates most foods and drinks . Consider portion sizes and frequency of consumption of certain foods   Meal Ideas & Options:  . Breakfast:  o Whole wheat toast or whole wheat English muffins with peanut butter & hard boiled egg o Steel cut oats topped with apples & cinnamon and skim milk  o Fresh fruit: banana, strawberries, melon, berries, peaches  o Smoothies: strawberries, bananas, greek yogurt, peanut butter o Low fat  greek yogurt with blueberries and granola  o Egg white omelet with spinach and mushrooms o Breakfast couscous: whole wheat couscous, apricots, skim milk, cranberries  . Sandwiches:  o Hummus and grilled vegetables (peppers, zucchini, squash) on whole wheat bread   o Grilled chicken on whole wheat pita with lettuce, tomatoes, cucumbers or tzatziki  o Tuna salad on whole wheat bread: tuna salad made with greek yogurt, olives, red peppers, capers, green onions o Garlic rosemary lamb pita: lamb sauted with garlic, rosemary, salt & pepper; add lettuce, cucumber, greek yogurt to pita - flavor with lemon juice and black pepper  . Seafood:  o Mediterranean grilled salmon, seasoned with garlic, basil, parsley, lemon juice and black pepper o Shrimp, lemon, and spinach whole-grain pasta salad made with low fat greek yogurt  o Seared scallops with lemon orzo  o Seared tuna steaks seasoned salt, pepper, coriander topped with tomato mixture of olives, tomatoes, olive oil, minced garlic, parsley, green onions and cappers  . Meats:  o Herbed greek chicken salad with kalamata olives, cucumber, feta  o  Red bell peppers stuffed with spinach, bulgur, lean ground beef (or lentils) & topped with feta   o Kebabs: skewers of chicken, tomatoes, onions, zucchini, squash  o [place] burgers: made with red onions, mint, dill, lemon juice, feta cheese topped with roasted red peppers . Vegetarian o Cucumber salad: cucumbers, artichoke hearts, celery, red onion, feta cheese, tossed in olive oil & lemon juice  o Hummus and whole grain pita points with a greek salad (lettuce, tomato, feta, olives, cucumbers, red onion) o Lentil soup with celery, carrots made with vegetable broth, garlic, salt and pepper  o Tabouli salad: parsley, bulgur, mint, scallions, cucumbers, tomato, radishes, lemon juice, olive oil, salt and pepper.

## 2017-04-16 DIAGNOSIS — E559 Vitamin D deficiency, unspecified: Secondary | ICD-10-CM | POA: Insufficient documentation

## 2017-04-16 DIAGNOSIS — E663 Overweight: Secondary | ICD-10-CM | POA: Insufficient documentation

## 2017-04-16 DIAGNOSIS — J3089 Other allergic rhinitis: Secondary | ICD-10-CM | POA: Insufficient documentation

## 2017-06-25 ENCOUNTER — Encounter: Payer: Self-pay | Admitting: Family Medicine

## 2017-06-25 ENCOUNTER — Ambulatory Visit: Payer: BC Managed Care – PPO | Admitting: Family Medicine

## 2017-06-25 VITALS — BP 125/87 | HR 66 | Ht 65.0 in | Wt 147.0 lb

## 2017-06-25 DIAGNOSIS — Z635 Disruption of family by separation and divorce: Secondary | ICD-10-CM | POA: Insufficient documentation

## 2017-06-25 DIAGNOSIS — Z7251 High risk heterosexual behavior: Secondary | ICD-10-CM

## 2017-06-25 NOTE — Patient Instructions (Addendum)
Melissa please also set put an order for vaginal swab for BV and yeast testing. thanks   Sexually Transmitted Disease A sexually transmitted disease (STD) is a disease or infection that may be passed (transmitted) from person to person, usually during sexual activity. This may happen by way of saliva, semen, blood, vaginal mucus, or urine. Common STDs include:  Gonorrhea.  Chlamydia.  Syphilis.  HIV and AIDS.  Genital herpes.  Hepatitis B and C.  Trichomonas.  Human papillomavirus (HPV).  Pubic lice.  Scabies.  Mites.  Bacterial vaginosis.  What are the causes? An STD may be caused by bacteria, a virus, or parasites. STDs are often transmitted during sexual activity if one person is infected. However, they may also be transmitted through nonsexual means. STDs may be transmitted after:  Sexual intercourse with an infected person.  Sharing sex toys with an infected person.  Sharing needles with an infected person or using unclean piercing or tattoo needles.  Having intimate contact with the genitals, mouth, or rectal areas of an infected person.  Exposure to infected fluids during birth.  What are the signs or symptoms? Different STDs have different symptoms. Some people may not have any symptoms. If symptoms are present, they may include:  Painful or bloody urination.  Pain in the pelvis, abdomen, vagina, anus, throat, or eyes.  A skin rash, itching, or irritation.  Growths, ulcerations, blisters, or sores in the genital and anal areas.  Abnormal vaginal discharge with or without bad odor.  Penile discharge in men.  Fever.  Pain or bleeding during sexual intercourse.  Swollen glands in the groin area.  Yellow skin and eyes (jaundice). This is seen with hepatitis.  Swollen testicles.  Infertility.  Sores and blisters in the mouth.  How is this diagnosed? To make a diagnosis, your health care provider may:  Take a medical history.  Perform a  physical exam.  Take a sample of any discharge to examine.  Swab the throat, cervix, opening to the penis, rectum, or vagina for testing.  Test a sample of your first morning urine.  Perform blood tests.  Perform a Pap test, if this applies.  Perform a colposcopy.  Perform a laparoscopy.  How is this treated? Treatment depends on the STD. Some STDs may be treated but not cured.  Chlamydia, gonorrhea, trichomonas, and syphilis can be cured with antibiotic medicine.  Genital herpes, hepatitis, and HIV can be treated, but not cured, with prescribed medicines. The medicines lessen symptoms.  Genital warts from HPV can be treated with medicine or by freezing, burning (electrocautery), or surgery. Warts may come back.  HPV cannot be cured with medicine or surgery. However, abnormal areas may be removed from the cervix, vagina, or vulva.  If your diagnosis is confirmed, your recent sexual partners need treatment. This is true even if they are symptom-free or have a negative culture or evaluation. They should not have sex until their health care providers say it is okay.  Your health care provider may test you for infection again 3 months after treatment.  How is this prevented? Take these steps to reduce your risk of getting an STD:  Use latex condoms, dental dams, and water-soluble lubricants during sexual activity. Do not use petroleum jelly or oils.  Avoid having multiple sex partners.  Do not have sex with someone who has other sex partners.  Do not have sex with anyone you do not know or who is at high risk for an STD.  Avoid  risky sex practices that can break your skin.  Do not have sex if you have open sores on your mouth or skin.  Avoid drinking too much alcohol or taking illegal drugs. Alcohol and drugs can affect your judgment and put you in a vulnerable position.  Avoid engaging in oral and anal sex acts.  Get vaccinated for HPV and hepatitis. If you have not  received these vaccines in the past, talk to your health care provider about whether one or both might be right for you.  If you are at risk of being infected with HIV, it is recommended that you take a prescription medicine daily to prevent HIV infection. This is called pre-exposure prophylaxis (PrEP). You are considered at risk if: ? You are a man who has sex with other men (MSM). ? You are a heterosexual man or woman and are sexually active with more than one partner. ? You take drugs by injection. ? You are sexually active with a partner who has HIV.  Talk with your health care provider about whether you are at high risk of being infected with HIV. If you choose to begin PrEP, you should first be tested for HIV. You should then be tested every 3 months for as long as you are taking PrEP.  Contact a health care provider if:  See your health care provider.  Tell your sexual partner(s). They should be tested and treated for any STDs.  Do not have sex until your health care provider says it is okay. Get help right away if: Contact your health care provider right away if:  You have severe abdominal pain.  You are a man and notice swelling or pain in your testicles.  You are a woman and notice swelling or pain in your vagina.  This information is not intended to replace advice given to you by your health care provider. Make sure you discuss any questions you have with your health care provider. Document Released: 03/31/2002 Document Revised: 07/29/2015 Document Reviewed: 07/29/2012 Elsevier Interactive Patient Education  2018 [employer]

## 2017-06-25 NOTE — Progress Notes (Signed)
Impression and Recommendations:    1. High risk heterosexual behavior-  husband cheated on her.   2. Marital disruption involving divorce- her spouse cheated on her.     1. STD Screen - Spousal Infidelity, Potentially High Risk Sexual Behavior - For the patient's STD concerns due to her husband's infidelity, recommended blood test now, then in 6 months, then again in 12 months.  - Advised patient to avoid sexual contact with her husband.  - In the future, recommended patient to tell future partners to go for full battery STD testing, bring the results to each other, and share them together.  - Blood work drawn today, along with urinalysis.  - Discussed methods to take care of the patient's physical and mental well-being during this time.  Encouraged patient to seek the support of loved ones, support groups, or medication if needed.  Counseling strongly recommended.  2. Follow-Up - Lab work drawn today along with STD screen.  - Return for regularly scheduled chronic OV and follow-up.    Orders Placed This Encounter  Procedures  . Chlamydia/Gonococcus/Trichomonas, NAA  . HepB+HepC+HIV Panel  . RPR  . HSV Type I/II IgG, IgMw/ reflex  . HIV antibody (with reflex)    No orders of the defined types were placed in this encounter.   Gross side effects, risk and benefits, and alternatives of medications and treatment plan in general discussed with patient.  Patient is aware that all medications have potential side effects and we are unable to predict every side effect or drug-drug interaction that may occur.   Patient will call with any questions prior to using medication if they have concerns.  Expresses verbal understanding and consents to current therapy and treatment regimen.  No barriers to understanding were identified.  Red flag symptoms and signs discussed in detail.  Patient expressed understanding regarding what to do in case of emergency\urgent symptoms  Please see  AVS handed out to patient at the end of our visit for further patient instructions/ counseling done pertaining to today's office visit.   Return if symptoms worsen or fail to improve, for Also follow-up chronic care..    Note: This note was prepared with assistance of Dragon voice recognition software. Occasional wrong-word or sound-a-like substitutions may have occurred due to the inherent limitations of voice recognition software.   This document serves as a record of services personally performed by Thomasene Loteborah Domino Holten, DO. It was created on her behalf by Peggye FothergillKatherine Galloway, a trained medical scribe. The creation of this record is based on the scribe's personal observations and the provider's statements to them.   I have reviewed the above medical documentation for accuracy and completeness and I concur.  Thomasene LotDeborah Nikkole Placzek 06/30/17 9:36 PM   --------------------------------------------------------------------------------------------------------------------------------------    Subjective:     HPI: Donna FasterJustine Contreras is a 37 y.o. female who [facility] issues as discussed below.  Patient is here today for STD screen.  She found out that her husband has been with someone else for the past three months.  Patient knows the person he was with and notes "I need everything done."  Patient plans to get a divorce.  When asked how she's doing, states "I'll be alright."  Patient isn't sure if her husband has been tested for STD's.  No known history of HIV, Hepatitis, etc., in her husband or his partner.    Patient notes no symptoms; no discharge or rash, nothing  new.  Patient last had sex with her husband about 3-4 weeks ago.    Wt Readings from Last 3 Encounters:  06/25/17 147 lb (66.7 kg)  04/11/17 160 lb 9.6 oz (72.8 kg)  03/08/14 155 lb 12.8 oz (70.7 kg)   BP Readings from Last 3 Encounters:  06/25/17 125/87  04/11/17 121/80    03/08/14 112/72   Pulse Readings from Last 3 Encounters:  06/25/17 66  04/11/17 98  03/08/14 72   BMI Readings from Last 3 Encounters:  06/25/17 24.46 kg/m  04/11/17 26.73 kg/m  03/08/14 25.93 kg/m     Patient Care Team    Relationship Specialty Notifications Start End  Thomasene Lot, DO PCP - General Family Medicine  04/11/17      Patient Active Problem List   Diagnosis Date Noted  . SVT (supraventricular tachycardia) (HCC) 11/30/2013    Priority: High  . Marital disruption involving divorce- her spouse cheated on her. 06/25/2017    Priority: Medium  . High risk heterosexual behavior-  husband cheated on her. 06/25/2017    Priority: Medium  . Overweight (BMI 25.0-29.9) 04/16/2017    Priority: Medium  . GERD (gastroesophageal reflux disease) 11/30/2013    Priority: Medium  . h/o Vitamin D deficiency 04/16/2017    Priority: Low  . Environmental and seasonal allergies 04/16/2017    Priority: Low  . H/O bee sting allergy 11/30/2013    Priority: Low  . Vaginal itching 03/08/2014  . Left thyroid nodule 11/30/2013  . Seasonal allergic rhinitis 11/30/2013  . Submandibular lymphadenopathy 11/30/2013    Past Medical history, Surgical history, Family history, Social history, Allergies and Medications have been entered into the medical record, reviewed and changed as needed.    Current Meds  Medication Sig  . EPINEPHrine (EPIPEN IJ) Inject as directed. As needed  . loratadine (CLARITIN) 10 MG tablet Take 10 mg by mouth daily.    Allergies:  Allergies  Allergen Reactions  . Bee Venom Anaphylaxis     Review of Systems:  A fourteen system review of systems was performed and found to be positive as per HPI.   Objective:   Blood pressure 125/87, pulse 66, height 5\' 5"  (1.651 m), weight 147 lb (66.7 kg), SpO2 99 %. Body mass index is 24.46 kg/m. General:  Well Developed, well nourished, appropriate for stated age.  Neuro:  Alert and oriented,  extra-ocular  muscles intact  HEENT:  Normocephalic, atraumatic, neck supple, no carotid bruits appreciated  Skin:  no gross rash, warm, pink. Cardiac:  RRR, S1 S2 Respiratory:  ECTA B/L and A/P, Not using accessory muscles, speaking in full sentences- unlabored. Vascular:  Ext warm, no cyanosis apprec.; cap RF less 2 sec. Psych:  No HI/SI, judgement and insight good, Euthymic mood. Full Affect. Genital Exam: Ext genitalia without lesion, no rash, scant amount of whitish discharge, No tenderness;  Cervix: WNL's w/o discharge or lesion; Adnexa:  No tenderness or palpable masses.

## 2017-06-26 ENCOUNTER — Telehealth: Payer: Self-pay | Admitting: Family Medicine

## 2017-06-26 NOTE — Telephone Encounter (Signed)
Patient requesting lab result info

## 2017-06-26 NOTE — Telephone Encounter (Signed)
Called patient and notified her of the results that we have received at this time. MPulliam, CMA/RT(R)

## 2017-06-27 LAB — NUSWAB BV AND CANDIDA, NAA
Candida albicans, NAA: NEGATIVE
Candida glabrata, NAA: NEGATIVE

## 2017-06-27 LAB — CHLAMYDIA/GONOCOCCUS/TRICHOMONAS, NAA
Chlamydia by NAA: NEGATIVE
Gonococcus by NAA: NEGATIVE
TRICH VAG BY NAA: NEGATIVE

## 2017-06-28 LAB — RPR: RPR: NONREACTIVE

## 2017-06-28 LAB — HEPB+HEPC+HIV PANEL
HEP B E AB: NEGATIVE
HEP B S AG: NEGATIVE
HIV SCREEN 4TH GENERATION: NONREACTIVE
Hep B C IgM: NEGATIVE
Hep B Core Total Ab: NEGATIVE
Hep B E Ag: NEGATIVE
Hep B Surface Ab, Qual: REACTIVE

## 2017-06-28 LAB — HSV TYPE I/II IGG, IGMW/ REFLEX
HSV 1 Glycoprotein G Ab, IgG: 16.8 index — ABNORMAL HIGH (ref 0.00–0.90)
HSV 1 IgM: <1:10 {titer}
HSV 2 IgG, Type Spec: <0.91 index (ref 0.00–0.90)

## 2017-06-30 ENCOUNTER — Encounter: Payer: Self-pay | Admitting: Family Medicine

## 2017-06-30 DIAGNOSIS — B009 Herpesviral infection, unspecified: Secondary | ICD-10-CM | POA: Insufficient documentation

## 2017-11-25 ENCOUNTER — Ambulatory Visit: Payer: BC Managed Care – PPO | Admitting: Family Medicine

## 2017-11-26 ENCOUNTER — Ambulatory Visit: Payer: BC Managed Care – PPO

## 2017-11-26 ENCOUNTER — Ambulatory Visit: Payer: BC Managed Care – PPO | Admitting: Family Medicine

## 2017-11-26 ENCOUNTER — Encounter: Payer: Self-pay | Admitting: Family Medicine

## 2017-11-26 VITALS — BP 136/82 | HR 81 | Temp 98.8°F | Ht 65.0 in | Wt 143.3 lb

## 2017-11-26 DIAGNOSIS — M62830 Muscle spasm of back: Secondary | ICD-10-CM | POA: Diagnosis not present

## 2017-11-26 DIAGNOSIS — Z9181 History of falling: Secondary | ICD-10-CM

## 2017-11-26 DIAGNOSIS — S2020XA Contusion of thorax, unspecified, initial encounter: Secondary | ICD-10-CM

## 2017-11-26 DIAGNOSIS — S2243XA Multiple fractures of ribs, bilateral, initial encounter for closed fracture: Secondary | ICD-10-CM | POA: Diagnosis not present

## 2017-11-26 LAB — POCT URINALYSIS DIPSTICK
BILIRUBIN UA: NEGATIVE
Glucose, UA: NEGATIVE
Ketones, UA: NEGATIVE
LEUKOCYTES UA: NEGATIVE
Nitrite, UA: NEGATIVE
PH UA: 5.5 (ref 5.0–8.0)
PROTEIN UA: NEGATIVE
Spec Grav, UA: 1.025 (ref 1.010–1.025)
UROBILINOGEN UA: 1 U/dL

## 2017-11-26 MED ORDER — OMEPRAZOLE 20 MG PO CPDR: 20.0000 mg | DELAYED_RELEASE_CAPSULE | Freq: Every day | ORAL | 11 refills | Status: DC

## 2017-11-26 MED ORDER — CYCLOBENZAPRINE HCL 10 MG PO TABS: 10.0000 mg | ORAL_TABLET | Freq: Three times a day (TID) | ORAL | 0 refills | Status: DC | PRN

## 2017-11-26 MED ORDER — IBUPROFEN 800 MG PO TABS: 800.0000 mg | ORAL_TABLET | Freq: Three times a day (TID) | ORAL | 0 refills | Status: DC | PRN

## 2017-11-26 NOTE — Progress Notes (Signed)
Pt here for an acute care OV today   Impression and Recommendations:    1. Closed fracture of multiple ribs of both sides, initial encounter   2. Status post fall   3. Muscle spasm of back   4. Contusion of thoracic wall, unspecified area of thoracic wall, initial encounter     Closed fracture of multiple ribs of both sides, initial encounter   Status post fall - Urinalysis negative - X-ray to rule out rib fractures - Chest x-rays (left and right chest wall taken) confirm rib fractures. Due to quality of left side image, only right side was confirmed but left fractures are highly likely due to PE findings and MOI. - Advised her to walk as much as possible and to stretch- comfortably, not til pain.  Muscle spasm of back - Prescribed muscle relaxer  Contusion of thoracic wall, unspecified area of thoracic wall, initial encounter - Prescribed pain medicine- NSAID - R/B d/c pt   Meds ordered this encounter  Medications  . ibuprofen (ADVIL,MOTRIN) 800 MG tablet    Sig: Take 1 tablet (800 mg total) by mouth every 8 (eight) hours as needed.    Dispense:  60 tablet    Refill:  0  . cyclobenzaprine (FLEXERIL) 10 MG tablet    Sig: Take 1 tablet (10 mg total) by mouth 3 (three) times daily as needed for muscle spasms.    Dispense:  30 tablet    Refill:  0  . omeprazole (PRILOSEC) 20 MG capsule    Sig: Take 1 capsule (20 mg total) by mouth daily.    Dispense:  30 capsule    Refill:  11    Orders Placed This Encounter  Procedures  . DG Ribs Unilateral W/Chest Right  . DG Ribs Unilateral Left  . POCT urinalysis dipstick     Education and routine counseling performed. Handouts provided  Gross side effects, risk and benefits, and alternatives of medications and treatment plan in general discussed with patient.  Patient is aware that all medications have potential side effects and we are unable to predict every side effect or drug-drug interaction that may occur.    Patient will call with any questions prior to using medication if they have concerns.    Expresses verbal understanding and consents to current therapy and treatment regimen.  No barriers to understanding were identified.  Red flag symptoms and signs discussed in detail.  Patient expressed understanding regarding what to do in case of emergency\urgent symptoms   Please see AVS handed out to patient at the end of our visit for further patient instructions/ counseling done pertaining to today's office visit.   Return if symptoms worsen or fail to improve.     Note:  This document was prepared occasionally using Dragon voice recognition software and may include unintentional dictation errors in addition to a scribe.  This document serves as a record of services personally performed by Thomasene Lot, DO. It was created on her behalf by Mickie Bail, a trained medical scribe. The creation of this record is based on the scribe's personal observations and the provider's statements to them.   I have reviewed the above medical documentation for accuracy and completeness and I concur.  Thomasene Lot, D.O.     --------------------------------------------------------------------------------------------------------------------------------------------------------------------------------------------------------------------------------------------    Subjective:    CC:  Chief Complaint  Patient presents with  . Fall    patient fell on Sunday and landed against chair hitting her abd.  Pt c/o  abd pain and back pain    HPI: Donna Contreras is a 37 y.o. female who presents to [facility] today for issues as discussed below.   Acute Trauma: - She was standing on a pub chair, and fell right directly on her stomach on the back of the chair. She then lay on the floor trying to catch her breath.  - Occurred Sunday, 11/3.  - She reports normal urinary and bowel  behaviors and denies hematuria, but she does feel bloated.  - She notes the pain spread from her abdomen to her lower back.    No problems updated.   Wt Readings from Last 3 Encounters:  11/26/17 143 lb 4.8 oz (65 kg)  06/25/17 147 lb (66.7 kg)  04/11/17 160 lb 9.6 oz (72.8 kg)   BP Readings from Last 3 Encounters:  11/26/17 136/82  06/25/17 125/87  04/11/17 121/80   BMI Readings from Last 3 Encounters:  11/26/17 23.85 kg/m  06/25/17 24.46 kg/m  04/11/17 26.73 kg/m     Patient Care Team    Relationship Specialty Notifications Start End  Thomasene Lot, DO PCP - General Family Medicine  04/11/17      Patient Active Problem List   Diagnosis Date Noted  . SVT (supraventricular tachycardia) (HCC) 11/30/2013    Priority: High  . Marital disruption involving divorce- her spouse cheated on her. 06/25/2017    Priority: Medium  . High risk heterosexual behavior-  husband cheated on her. 06/25/2017    Priority: Medium  . Overweight (BMI 25.0-29.9) 04/16/2017    Priority: Medium  . GERD (gastroesophageal reflux disease) 11/30/2013    Priority: Medium  . 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 h/o Vitamin D deficiency 04/16/2017    Priority: Low  . Environmental and seasonal allergies 04/16/2017    Priority: Low  . H/O bee sting allergy 11/30/2013    Priority: Low  . Multiple closed fractures of ribs of both sides 11/26/2017  . Vaginal itching 03/08/2014  . Left thyroid nodule 11/30/2013  . Seasonal allergic rhinitis 11/30/2013  . Submandibular lymphadenopathy 11/30/2013      Past Medical History:  Diagnosis Date  . History of cardiac murmur   . History of chicken pox   . History of gastroesophageal reflux (GERD)   . History of hay fever   . History of thyroid disease   . SVT (supraventricular tachycardia) (HCC)    as a teen  . Thyroid nodule    left     Past Surgical History:  Procedure Laterality Date  .  CARDIAC ELECTROPHYSIOLOGY STUDY AND ABLATION    . TONSILLECTOMY AND ADENOIDECTOMY    . WISDOM TOOTH EXTRACTION       Family History  Problem Relation Age of Onset  . Heart attack Maternal Grandfather   . Arthritis Maternal Grandmother   . Stroke Paternal Grandmother   . Diabetes Father   . Skin cancer Paternal Grandfather      Social History   Socioeconomic History  . Marital status: Married    Spouse name: Not on file  . Number of children: Not on file  . Years of education: Not on file  . Highest education level: Not on file  Occupational History  . Not on file  Social Needs  . Financial resource strain: Not on file  . Food insecurity:    Worry: Not on file    Inability: Not on  file  . Transportation needs:    Medical: Not on file    Non-medical: Not on file  Tobacco Use  . Smoking status: Former Smoker    Packs/day: 0.25    Last attempt to quit: 2014    Years since quitting: 5.8  . Smokeless tobacco: Never Used  Substance and Sexual Activity  . Alcohol use: Yes    Alcohol/week: 5.0 standard drinks    Types: 5 Standard drinks or equivalent per week    Comment: socially-weekends  . Drug use: No  . Sexual activity: Yes    Birth control/protection: None  Lifestyle  . Physical activity:    Days per week: Not on file    Minutes per session: Not on file  . Stress: Not on file  Relationships  . Social connections:    Talks on phone: Not on file    Gets together: Not on file    Attends religious service: Not on file    Active member of club or organization: Not on file    Attends meetings of clubs or organizations: Not on file    Relationship status: Not on file  . Intimate partner violence:    Fear of current or ex partner: Not on file    Emotionally abused: Not on file    Physically abused: Not on file    Forced sexual activity: Not on file  Other Topics Concern  . Not on file  Social History Narrative  . Not on file     Current Meds  Medication Sig    . EPINEPHrine (EPIPEN IJ) Inject as directed. As needed  . loratadine (CLARITIN) 10 MG tablet Take 10 mg by mouth daily.  . ranitidine (ZANTAC) 150 MG capsule Take 150 mg by mouth 2 (two) times daily.    Allergies:  Allergies  Allergen Reactions  . Bee Venom Anaphylaxis     Review of Systems: General:   Denies fever, chills, unexplained weight loss.  Optho/Auditory:   Denies visual changes, blurred vision/LOV Respiratory:   Denies wheeze, DOE more than baseline levels.   Cardiovascular:   Denies chest pain, palpitations, new onset peripheral edema  Gastrointestinal:   Denies nausea, vomiting, diarrhea, abd pain.  Genitourinary: Denies dysuria, freq/ urgency, flank pain or discharge from genitals.  Endocrine:     Denies hot or cold intolerance, polyuria, polydipsia. Musculoskeletal:   Reports abdomen and back pain Skin:  Denies new onset rash, suspicious lesions Neurological:     Denies dizziness, unexplained weakness, numbness  Psychiatric/Behavioral:   Denies mood changes, suicidal or homicidal ideations, hallucinations    Objective:   Blood pressure 136/82, pulse 81, temperature 98.8 F (37.1 C), height 5\' 5"  (1.651 m), weight 143 lb 4.8 oz (65 kg), SpO2 100 %. Body mass index is 23.85 kg/m. General:  Well Developed, well nourished, appropriate for stated age.  Neuro:  Alert and oriented,  extra-ocular muscles intact  HEENT:  Normocephalic, atraumatic, neck supple Skin:  no gross rash, warm, pink. Cardiac:  RRR, S1 S2 Respiratory:  ECTA B/L and A/P, Not using accessory muscles, speaking in full sentences- unlabored. Vascular:  Ext warm, no cyanosis apprec.; cap RF less 2 sec. Psych:  No HI/SI, judgement and insight good, Euthymic mood. Full Affect. M-sk: tenderness along anterior left lower ribs, no flank tenderness but bilateral paravertebral muscles of her back with spasms, T9-L2, most prominent on the right Abd: no rebound or rigidity, some guarding with deep palpation  to epigastric region, no suprapubic tenderness

## 2017-11-26 NOTE — Patient Instructions (Signed)
Rib Fracture ° °A rib fracture is a break or crack in one of the bones of the ribs. The ribs are a group of long, curved bones that wrap around your chest and attach to your spine. They protect your lungs and other organs in the chest cavity. A broken or cracked rib is often painful, but most do not cause other problems. Most rib fractures heal on their own over time. However, rib fractures can be more serious if multiple ribs are broken or if broken ribs move out of place and push against other structures. °What are the causes? °· A direct blow to the chest. For example, this could happen during contact sports, a car accident, or a fall against a hard object. °· Repetitive movements with high force, such as pitching a baseball or having severe coughing spells. °What are the signs or symptoms? °· Pain when you breathe in or cough. °· Pain when someone presses on the injured area. °How is this diagnosed? °Your caregiver will perform a physical exam. Various imaging tests may be ordered to confirm the diagnosis and to look for related injuries. These tests may include a chest X-ray, computed tomography (CT), magnetic resonance imaging (MRI), or a bone scan. °How is this treated? °Rib fractures usually heal on their own in 1-3 months. The longer healing period is often associated with a continued cough or other aggravating activities. During the healing period, pain control is very important. Medication is usually given to control pain. Hospitalization or surgery may be needed for more severe injuries, such as those in which multiple ribs are broken or the ribs have moved out of place. °Follow these instructions at home: °· Avoid strenuous activity and any activities or movements that cause pain. Be careful during activities and avoid bumping the injured rib. °· Gradually increase activity as directed by your caregiver. °· Only take over-the-counter or prescription medications as directed by your caregiver. Do not take  other medications without asking your caregiver first. °· Apply ice to the injured area for the first 1-2 days after you have been treated or as directed by your caregiver. Applying ice helps to reduce inflammation and pain. °? Put ice in a plastic bag. °? Place a towel between your skin and the bag. °? Leave the ice on for 15-20 minutes at a time, every 2 hours while you are awake. °· Perform deep breathing as directed by your caregiver. This will help prevent pneumonia, which is a common complication of a broken rib. Your caregiver may instruct you to: °? Take deep breaths several times a day. °? Try to cough several times a day, holding a pillow against the injured area. °? Use a device called an incentive spirometer to practice deep breathing several times a day. °· Drink enough fluids to keep your urine clear or pale yellow. This will help you avoid constipation. °· Do not wear a rib belt or binder. These restrict breathing, which can lead to pneumonia. °Get help right away if: °· You have a fever. °· You have difficulty breathing or shortness of breath. °· You develop a continual cough, or you cough up thick or bloody sputum. °· You feel sick to your stomach (nausea), throw up (vomit), or have abdominal pain. °· You have worsening pain not controlled with medications. °This information is not intended to replace advice given to you by your health care provider. Make sure you discuss any questions you have with your health care provider. °Document Released: 01/08/2005   Document Revised: 06/16/2015 Document Reviewed: 03/12/2012 °[employer] Interactive Patient Education © 2018 [employer] ° °

## 2017-12-25 ENCOUNTER — Other Ambulatory Visit: Payer: Self-pay | Admitting: Obstetrics and Gynecology

## 2017-12-25 ENCOUNTER — Other Ambulatory Visit (HOSPITAL_COMMUNITY)
Admission: RE | Admit: 2017-12-25 | Discharge: 2017-12-25 | Disposition: A | Discharge status: Home / Self Care | Payer: BC Managed Care – PPO | Source: Ambulatory Visit | Attending: Obstetrics and Gynecology | Admitting: Obstetrics and Gynecology

## 2017-12-25 DIAGNOSIS — Z01411 Encounter for gynecological examination (general) (routine) with abnormal findings: Secondary | ICD-10-CM | POA: Diagnosis present

## 2017-12-27 LAB — CYTOLOGY - PAP: Diagnosis: NEGATIVE

## 2018-02-28 ENCOUNTER — Other Ambulatory Visit: Payer: Self-pay | Admitting: Obstetrics and Gynecology

## 2018-03-14 ENCOUNTER — Encounter: Payer: Self-pay | Admitting: Family Medicine

## 2018-03-14 ENCOUNTER — Ambulatory Visit: Payer: BC Managed Care – PPO | Admitting: Family Medicine

## 2018-03-14 VITALS — BP 104/69 | HR 72 | Temp 98.5°F | Ht 65.0 in | Wt 143.0 lb

## 2018-03-14 DIAGNOSIS — J3089 Other allergic rhinitis: Secondary | ICD-10-CM | POA: Diagnosis not present

## 2018-03-14 DIAGNOSIS — K219 Gastro-esophageal reflux disease without esophagitis: Secondary | ICD-10-CM | POA: Diagnosis not present

## 2018-03-14 DIAGNOSIS — J329 Chronic sinusitis, unspecified: Secondary | ICD-10-CM | POA: Diagnosis not present

## 2018-03-14 MED ORDER — FLUTICASONE PROPIONATE 50 MCG/ACT NA SUSP: NASAL | 2 refills | Status: AC

## 2018-03-14 MED ORDER — FEXOFENADINE-PSEUDOEPHED ER 180-240 MG PO TB24: 1.0000 | ORAL_TABLET | Freq: Every day | ORAL | 1 refills | Status: DC

## 2018-03-14 MED ORDER — PANTOPRAZOLE SODIUM 40 MG PO TBEC: 40.0000 mg | DELAYED_RELEASE_TABLET | Freq: Every day | ORAL | 1 refills | Status: DC

## 2018-03-14 NOTE — Progress Notes (Signed)
Pt here for an acute care OV today  Impression and Recommendations:    1. Gastroesophageal reflux disease, esophagitis presence not specified   2. Environmental and seasonal allergies   3. Chronic sinusitis, unspecified location     1. Environmental & Seasonal Allergies, Chronic Sinusitis - Educated patient that our glands can become swollen due to allergies.  - Steroids declined today.  - Advised the patient to begin using AYR or Neilmed sinus rinses BID followed by flonase BID (one spray to each nostril).  Advised that the patient may also incorporate allegra PRN (switching from Claritin).   - Continue treatment plan as prescribed.  - Will continue to monitor.  2. GERD - Encouraged patient to take treatment plan as prescribed.  See med list. - Protonix recommended today. - Reviewed triggers with patient in office today. - Extensive education about acid reflux provided today.  All questions were answered.  - Will continue to monitor.  3. Tobacco Abuse & Smoking Cessation - Per patient, down to 1-2 cigarettes per day.  - Told pt to think seriously about completely quitting smoking!  Told pt it is very important for his/her health and well being.    Smoking cessation instruction/counseling given:  counseled patient on the dangers of tobacco use, advised patient to stop smoking, and reviewed strategies to maximize success  Discussed with patient that there are multiple treatments to aid in quitting smoking, however I explained none will work unless pt really want to quit  Told to call 1-800-QUIT-NOW 859 385 8223(1-718-886-1289-669) for free smoking cessation counseling and support, or pt can go online to www.heart.org - the American Heart Association website and search "quit smoking ".   4. Healing rib fracture from 3 months ago - Education provided about prudent recovery from injury today.  All questions were answered. - Advised patient to exercise and increase blood flow to the  area. - Will continue to monitor.   Meds ordered this encounter  Medications  . fluticasone (FLONASE) 50 MCG/ACT nasal spray    Sig: 1 spray each nostril following sinus rinses twice daily    Dispense:  16 g    Refill:  2  . fexofenadine-pseudoephedrine (ALLEGRA-D ALLERGY & CONGESTION) 180-240 MG 24 hr tablet    Sig: Take 1 tablet by mouth daily.    Dispense:  90 tablet    Refill:  1  . pantoprazole (PROTONIX) 40 MG tablet    Sig: Take 1 tablet (40 mg total) by mouth daily.    Dispense:  90 tablet    Refill:  1    Medications Discontinued During This Encounter  Medication Reason  . cyclobenzaprine (FLEXERIL) 10 MG tablet Completed Course  . ranitidine (ZANTAC) 150 MG capsule Patient Preference  . ibuprofen (ADVIL,MOTRIN) 800 MG tablet   . loratadine (CLARITIN) 10 MG tablet   . omeprazole (PRILOSEC) 20 MG capsule       Education and routine counseling performed. Handouts provided  Gross side effects, risk and benefits, and alternatives of medications and treatment plan in general discussed with patient.  Patient is aware that all medications have potential side effects and we are unable to predict every side effect or drug-drug interaction that may occur.   Patient will call with any questions prior to using medication if they have concerns.    Expresses verbal understanding and consents to current therapy and treatment regimen.  No barriers to understanding were identified.  Red flag symptoms and signs discussed in detail.  Patient expressed understanding  regarding what to do in case of emergency\urgent symptoms   Please see AVS handed out to patient at the end of our visit for further patient instructions/ counseling done pertaining to today's office visit.   Return for 2) f/up 2 mo- GERD and sinsutis.     Note:  This document was prepared occasionally using Dragon voice recognition software and may include unintentional dictation errors in addition to a scribe.  This  document serves as a record of services personally performed by Thomasene Lot, DO. It was created on her behalf by Peggye Fothergill, a trained medical scribe. The creation of this record is based on the scribe's personal observations and the provider's statements to them.   I have reviewed the above medical documentation for accuracy and completeness and I concur.  Thomasene Lot, DO 03/20/2018 2:15 PM        ------------------------------------------------------------------------------------------------------------------------------------    Subjective:    CC:  Chief Complaint  Patient presents with  . lump in throat    HPI: Donna Contreras is a 38 y.o. female who presents to [facility] today for issues as discussed below.  Says she feels like her "glands are swollen," "like something's in her throat."  Sometimes when she gets acid reflux and doesn't treat it, states she experiences similar symptoms.  GERD She took 40 mg of Prilosec yesterday to no relief.  Her throat feels "tight," "like there's something in there."  These symptoms began three days ago.  Experiencing post-nasal drip due to allergies.  She takes Claritin daily.  Denies waking up with a metallic or otherwise unpleasant taste in her mouth.  She tries not to eat or drink before lying down because she knows that makes her reflux worse.  Denies headache or sinus pressure.  Denies cold symptoms.  Smoking Cigarettes She is still smoking every now and then.  She is down to a couple of cigarettes per day.  Alcohol Consumption Drinks alcohol on the weekend.  Rib Fracture in November Patient notes she sometimes experiences tightness in her back since her injury in November.   No problems updated.   Wt Readings from Last 3 Encounters:  03/14/18 143 lb (64.9 kg)  11/26/17 143 lb 4.8 oz (65 kg)  06/25/17 147 lb (66.7 kg)   BP Readings from Last 3 Encounters:   03/14/18 104/69  11/26/17 136/82  06/25/17 125/87   BMI Readings from Last 3 Encounters:  03/14/18 23.80 kg/m  11/26/17 23.85 kg/m  06/25/17 24.46 kg/m     Patient Care Team    Relationship Specialty Notifications Start End  Thomasene Lot, DO PCP - General Family Medicine  04/11/17      Patient Active Problem List   Diagnosis Date Noted  . Multiple closed fractures of ribs of both sides 11/26/2017  . 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 Marital disruption involving divorce- her spouse cheated on her. 06/25/2017  . High risk heterosexual behavior-  husband cheated on her. 06/25/2017  . h/o Vitamin D deficiency 04/16/2017  . Environmental and seasonal allergies 04/16/2017  . Vaginal itching 03/08/2014  . Left thyroid nodule 11/30/2013  . GERD (gastroesophageal reflux disease) 11/30/2013  . Seasonal allergic rhinitis 11/30/2013  . SVT (supraventricular tachycardia) (HCC) 11/30/2013  . H/O bee sting allergy 11/30/2013  . Submandibular lymphadenopathy 11/30/2013      Past Medical History:  Diagnosis Date  . History of cardiac murmur   . History of chicken pox   .  History of gastroesophageal reflux (GERD)   . History of hay fever   . History of thyroid disease   . SVT (supraventricular tachycardia) (HCC)    as a teen  . Thyroid nodule    left     Past Surgical History:  Procedure Laterality Date  . CARDIAC ELECTROPHYSIOLOGY STUDY AND ABLATION    . TONSILLECTOMY AND ADENOIDECTOMY    . WISDOM TOOTH EXTRACTION       Family History  Problem Relation Age of Onset  . Heart attack Maternal Grandfather   . Arthritis Maternal Grandmother   . Stroke Paternal Grandmother   . Diabetes Father   . Skin cancer Paternal Grandfather      Social History   Socioeconomic History  . Marital status: Married    Spouse name: Not on file  . Number of children: Not on file  . Years of education: Not on file  . Highest education level: Not on  file  Occupational History  . Not on file  Social Needs  . Financial resource strain: Not on file  . Food insecurity:    Worry: Not on file    Inability: Not on file  . Transportation needs:    Medical: Not on file    Non-medical: Not on file  Tobacco Use  . Smoking status: Current Every Day Smoker    Packs/day: 0.25    Last attempt to quit: 2014    Years since quitting: 6.1  . Smokeless tobacco: Never Used  Substance and Sexual Activity  . Alcohol use: Yes    Alcohol/week: 5.0 standard drinks    Types: 5 Standard drinks or equivalent per week    Comment: socially-weekends  . Drug use: No  . Sexual activity: Yes    Birth control/protection: None  Lifestyle  . Physical activity:    Days per week: Not on file    Minutes per session: Not on file  . Stress: Not on file  Relationships  . Social connections:    Talks on phone: Not on file    Gets together: Not on file    Attends religious service: Not on file    Active member of club or organization: Not on file    Attends meetings of clubs or organizations: Not on file    Relationship status: Not on file  . Intimate partner violence:    Fear of current or ex partner: Not on file    Emotionally abused: Not on file    Physically abused: Not on file    Forced sexual activity: Not on file  Other Topics Concern  . Not on file  Social History Narrative  . Not on file     Current Meds  Medication Sig  . EPINEPHrine (EPIPEN IJ) Inject as directed. As needed  . [DISCONTINUED] ibuprofen (ADVIL,MOTRIN) 800 MG tablet Take 1 tablet (800 mg total) by mouth every 8 (eight) hours as needed.  . [DISCONTINUED] loratadine (CLARITIN) 10 MG tablet Take 10 mg by mouth daily.  . [DISCONTINUED] omeprazole (PRILOSEC) 20 MG capsule Take 1 capsule (20 mg total) by mouth daily.    Allergies:  Allergies  Allergen Reactions  . Bee Venom Anaphylaxis     Review of Systems: General:   Denies fever, chills, unexplained weight loss.   Optho/Auditory:   Denies visual changes, blurred vision/LOV Respiratory:   Denies wheeze, DOE more than baseline levels.   Cardiovascular:   Denies chest pain, palpitations, new onset peripheral edema  Gastrointestinal:   Denies  nausea, vomiting, diarrhea, abd pain.  Genitourinary: Denies dysuria, freq/ urgency, flank pain or discharge from genitals.  Endocrine:     Denies hot or cold intolerance, polyuria, polydipsia. Musculoskeletal:   Denies unexplained myalgias, joint swelling, unexplained arthralgias, gait problems.  Skin:  Denies new onset rash, suspicious lesions Neurological:     Denies dizziness, unexplained weakness, numbness  Psychiatric/Behavioral:   Denies mood changes, suicidal or homicidal ideations, hallucinations    Objective:   Blood pressure 104/69, pulse 72, temperature 98.5 F (36.9 C), height 5\' 5"  (1.651 m), weight 143 lb (64.9 kg), last menstrual period 02/13/2018, SpO2 99 %. Body mass index is 23.8 kg/m. General:  Well Developed, well nourished, appropriate for stated age.  Neuro:  Alert and oriented,  extra-ocular muscles intact  HEENT:  Air fluid levels in bilateral TM's, more on the left TM than the right.  Throat with mild erythema consistent with post-nasal drip.  Normocephalic, atraumatic, neck supple Skin:  no gross rash, warm, pink. Cardiac:  RRR, S1 S2 Respiratory:  ECTA B/L and A/P, Not using accessory muscles, speaking in full sentences- unlabored. Vascular:  Ext warm, no cyanosis apprec.; cap RF less 2 sec. Psych:  No HI/SI, judgement and insight good, Euthymic mood. Full Affect.

## 2018-03-14 NOTE — Patient Instructions (Addendum)
Sterile saline nasal rinses, such as Lloyd Huger med or AYR sinus rinses, can be very helpful and should be done twice daily- especially throughout the allergy season.   Remember you should use distilled water or previously boiled water to do this.  Then you may use over-the-counter Flonase 1 spray each nostril twice daily after sinus rinses, morning and night.  You can do this in addition to switching from Claritin to [place] daily.  You may begin with Allegra D, and switch to plain Allegra after your more acute symptoms have resolved.  If your eyes tend to get an itchy or irritated feeling when your seasonal allergies get bad, you can use Naphcon-A over-the-counter eyedrops as needed.    -Also you are going to DC your omeprazole and you will start Protonix.  To help with your reflux please see the below information and also please do not take Advil or Aleve  Gastroesophageal Reflux Disease, Adult Normally, food travels down the esophagus and stays in the stomach to be digested. However, when a person has gastroesophageal reflux disease (GERD), food and stomach acid move back up into the esophagus. When this happens, the esophagus becomes sore and inflamed. Over time, GERD can create small holes (ulcers) in the lining of the esophagus. What are the causes? This condition is caused by a problem with the muscle between the esophagus and the stomach (lower esophageal sphincter, or LES). Normally, the LES muscle closes after food passes through the esophagus to the stomach. When the LES is weakened or abnormal, it does not close properly, and that allows food and stomach acid to go back up into the esophagus. The LES can be weakened by certain dietary substances, medicines, and medical conditions, including:  Tobacco use.  Pregnancy.  Having a hiatal hernia.  Heavy alcohol use.  Certain foods and beverages, such as coffee, chocolate, onions, and peppermint.  What increases the risk? This condition is  more likely to develop in:  People who have an increased body weight.  People who have connective tissue disorders.  People who use NSAID medicines.  What are the signs or symptoms? Symptoms of this condition include:  Heartburn.  Difficult or painful swallowing.  The feeling of having a lump in the throat.  A bitter taste in the mouth.  Bad breath.  Having a large amount of saliva.  Having an upset or bloated stomach.  Belching.  Chest pain.  Shortness of breath or wheezing.  Ongoing (chronic) cough or a night-time cough.  Wearing away of tooth enamel.  Weight loss.  Different conditions can cause chest pain. Make sure to see your health care provider if you experience chest pain. How is this diagnosed? Your health care provider will take a medical history and perform a physical exam. To determine if you have mild or severe GERD, your health care provider may also monitor how you respond to treatment. You may also have other tests, including:  An endoscopy to examine your stomach and esophagus with a small camera.  A test that measures the acidity level in your esophagus.  A test that measures how much pressure is on your esophagus.  A barium swallow or modified barium swallow to show the shape, size, and functioning of your esophagus.  How is this treated? The goal of treatment is to help relieve your symptoms and to prevent complications. Treatment for this condition may vary depending on how severe your symptoms are. Your health care provider may recommend:  Changes to your  diet.  Medicine.  Surgery.  Follow these instructions at home: Diet  Follow a diet as recommended by your health care provider. This may involve avoiding foods and drinks such as: ? Coffee and tea (with or without caffeine). ? Drinks that contain alcohol. ? Energy drinks and sports drinks. ? Carbonated drinks or sodas. ? Chocolate and cocoa. ? Peppermint and mint  flavorings. ? Garlic and onions. ? Horseradish. ? Spicy and acidic foods, including peppers, chili powder, curry powder, vinegar, hot sauces, and barbecue sauce. ? Citrus fruit juices and citrus fruits, such as oranges, lemons, and limes. ? Tomato-based foods, such as red sauce, chili, salsa, and pizza with red sauce. ? Fried and fatty foods, such as donuts, french fries, potato chips, and high-fat dressings. ? High-fat meats, such as hot dogs and fatty cuts of red and white meats, such as rib eye steak, sausage, ham, and bacon. ? High-fat dairy items, such as whole milk, butter, and cream cheese.  Eat small, frequent meals instead of large meals.  Avoid drinking large amounts of liquid with your meals.  Avoid eating meals during the 2-3 hours before bedtime.  Avoid lying down right after you eat.  Do not exercise right after you eat. General instructions  Pay attention to any changes in your symptoms.  Take over-the-counter and prescription medicines only as told by your health care provider. Do not take aspirin, ibuprofen, or other NSAIDs unless your health care provider told you to do so.  Do not use any tobacco products, including cigarettes, chewing tobacco, and e-cigarettes. If you need help quitting, ask your health care provider.  Wear loose-fitting clothing. Do not wear anything tight around your waist that causes pressure on your abdomen.  Raise (elevate) the head of your bed 6 inches (15cm).  Try to reduce your stress, such as with yoga or meditation. If you need help reducing stress, ask your health care provider.  If you are overweight, reduce your weight to an amount that is healthy for you. Ask your health care provider for guidance about a safe weight loss goal.  Keep all follow-up visits as told by your health care provider. This is important. Contact a health care provider if:  You have new symptoms.  You have unexplained weight loss.  You have difficulty  swallowing, or it hurts to swallow.  You have wheezing or a persistent cough.  Your symptoms do not improve with treatment.  You have a hoarse voice. Get help right away if:  You have pain in your arms, neck, jaw, teeth, or back.  You feel sweaty, dizzy, or light-headed.  You have chest pain or shortness of breath.  You vomit and your vomit looks like blood or coffee grounds.  You faint.  Your stool is bloody or black.  You cannot swallow, drink, or eat. This information is not intended to replace advice given to you by your health care provider. Make sure you discuss any questions you have with your health care provider. Document Released: 10/18/2004 Document Revised: 06/08/2015 Document Reviewed: 05/05/2014 Elsevier Interactive Patient Education  2018 [employer]     Food Choices for Gastroesophageal Reflux Disease, Adult When you have gastroesophageal reflux disease (GERD), the foods you eat and your eating habits are very important. Choosing the right foods can help ease the discomfort of GERD. Consider working with a diet and nutrition specialist (dietitian) to help you make healthy food choices. What general guidelines should I follow? Eating plan  Choose healthy foods  low in fat, such as fruits, vegetables, whole grains, low-fat dairy products, and lean meat, fish, and poultry.  Eat frequent, small meals instead of three large meals each day. Eat your meals slowly, in a relaxed setting. Avoid bending over or lying down until 2-3 hours after eating.  Limit high-fat foods such as fatty meats or fried foods.  Limit your intake of oils, butter, and shortening to less than 8 teaspoons each day.  Avoid the following: ? Foods that cause symptoms. These may be different for different people. Keep a food diary to keep track of foods that cause symptoms. ? Alcohol. ? Drinking large amounts of liquid with meals. ? Eating meals during the 2-3 hours before bed.  Cook  foods using methods other than frying. This may include baking, grilling, or broiling. Lifestyle   Maintain a healthy weight. Ask your health care provider what weight is healthy for you. If you need to lose weight, work with your health care provider to do so safely.  Exercise for at least 30 minutes on 5 or more days each week, or as told by your health care provider.  Avoid wearing clothes that fit tightly around your waist and chest.  Do not use any products that contain nicotine or tobacco, such as cigarettes and e-cigarettes. If you need help quitting, ask your health care provider.  Sleep with the head of your bed raised. Use a wedge under the mattress or blocks under the bed frame to raise the head of the bed. What foods are not recommended? The items listed may not be a complete list. Talk with your dietitian about what dietary choices are best for you. Grains Pastries or quick breads with added fat. [place] toast. Vegetables Deep fried vegetables. [place] fries. Any vegetables prepared with added fat. Any vegetables that cause symptoms. For some people this may include tomatoes and tomato products, chili peppers, onions and garlic, and horseradish. Fruits Any fruits prepared with added fat. Any fruits that cause symptoms. For some people this may include citrus fruits, such as oranges, grapefruit, pineapple, and lemons. Meats and other protein foods High-fat meats, such as fatty beef or pork, hot dogs, ribs, ham, sausage, salami and bacon. Fried meat or protein, including fried fish and fried chicken. Nuts and nut butters. Dairy Whole milk and chocolate milk. Sour cream. Cream. Ice cream. Cream cheese. Milk shakes. Beverages Coffee and tea, with or without caffeine. Carbonated beverages. Sodas. Energy drinks. Fruit juice made with acidic fruits (such as orange or grapefruit). Tomato juice. Alcoholic drinks. Fats and oils Butter. Margarine. Shortening. Ghee. Sweets and  desserts Chocolate and cocoa. Donuts. Seasoning and other foods Pepper. Peppermint and spearmint. Any condiments, herbs, or seasonings that cause symptoms. For some people, this may include curry, hot sauce, or vinegar-based salad dressings. Summary  When you have gastroesophageal reflux disease (GERD), food and lifestyle choices are very important to help ease the discomfort of GERD.  Eat frequent, small meals instead of three large meals each day. Eat your meals slowly, in a relaxed setting. Avoid bending over or lying down until 2-3 hours after eating.  Limit high-fat foods such as fatty meat or fried foods. This information is not intended to replace advice given to you by your health care provider. Make sure you discuss any questions you have with your health care provider. Document Released: 01/08/2005 Document Revised: 01/10/2016 Document Reviewed: 01/10/2016 Elsevier Interactive Patient Education  [employer].

## 2018-07-09 ENCOUNTER — Other Ambulatory Visit: Payer: Self-pay

## 2018-07-09 ENCOUNTER — Ambulatory Visit: Payer: BC Managed Care – PPO | Admitting: Family Medicine

## 2018-07-09 ENCOUNTER — Encounter: Payer: Self-pay | Admitting: Family Medicine

## 2018-07-09 VITALS — BP 114/79 | HR 65 | Temp 98.6°F | Ht 65.0 in | Wt 149.4 lb

## 2018-07-09 DIAGNOSIS — F430 Acute stress reaction: Secondary | ICD-10-CM

## 2018-07-09 DIAGNOSIS — K219 Gastro-esophageal reflux disease without esophagitis: Secondary | ICD-10-CM

## 2018-07-09 DIAGNOSIS — R12 Heartburn: Secondary | ICD-10-CM | POA: Diagnosis not present

## 2018-07-09 DIAGNOSIS — J3089 Other allergic rhinitis: Secondary | ICD-10-CM | POA: Diagnosis not present

## 2018-07-09 MED ORDER — FAMOTIDINE 20 MG PO TABS: 20.0000 mg | ORAL_TABLET | Freq: Two times a day (BID) | ORAL | 0 refills | Status: DC

## 2018-07-09 MED ORDER — LEVOCETIRIZINE DIHYDROCHLORIDE 5 MG PO TABS: 5.0000 mg | ORAL_TABLET | Freq: Every evening | ORAL | 0 refills | Status: DC

## 2018-07-09 MED ORDER — FLUOXETINE HCL 10 MG PO TABS: ORAL_TABLET | ORAL | 0 refills | Status: DC

## 2018-07-09 NOTE — Patient Instructions (Addendum)
Please use NeilMed sinus rinses twice daily.  Also you can follow those rinses with 1 spray of Flonase each nostril twice a day.  This will definitely help to decrease the inflammation in your sinuses and hence the the pressure and all in your ears whenever you can   -obtain a stool sample for [place] please bring that in so we can run the test to see if you have the bacteria in your stomach that may be eating and causing some of your symptoms.  NO IBUPROFEN OR ALLEVE-      Food Choices for Gastroesophageal Reflux Disease, Adult When you have gastroesophageal reflux disease (GERD), the foods you eat and your eating habits are very important. Choosing the right foods can help ease the discomfort of GERD. Consider working with a diet and nutrition specialist (dietitian) to help you make healthy food choices. What general guidelines should I follow?  Eating plan  Choose healthy foods low in fat, such as fruits, vegetables, whole grains, low-fat dairy products, and lean meat, fish, and poultry.  Eat frequent, small meals instead of three large meals each day. Eat your meals slowly, in a relaxed setting. Avoid bending over or lying down until 2-3 hours after eating.  Limit high-fat foods such as fatty meats or fried foods.  Limit your intake of oils, butter, and shortening to less than 8 teaspoons each day.  Avoid the following: ? Foods that cause symptoms. These may be different for different people. Keep a food diary to keep track of foods that cause symptoms. ? Alcohol. ? Drinking large amounts of liquid with meals. ? Eating meals during the 2-3 hours before bed.  Cook foods using methods other than frying. This may include baking, grilling, or broiling. Lifestyle  Maintain a healthy weight. Ask your health care provider what weight is healthy for you. If you need to lose weight, work with your health care provider to do so safely.  Exercise for at least 30 minutes on 5 or more days each  week, or as told by your health care provider.  Avoid wearing clothes that fit tightly around your waist and chest.  Do not use any products that contain nicotine or tobacco, such as cigarettes and e-cigarettes. If you need help quitting, ask your health care provider.  Sleep with the head of your bed raised. Use a wedge under the mattress or blocks under the bed frame to raise the head of the bed. What foods are not recommended? The items listed may not be a complete list. Talk with your dietitian about what dietary choices are best for you. Grains Pastries or quick breads with added fat. [place] toast. Vegetables Deep fried vegetables. [place] fries. Any vegetables prepared with added fat. Any vegetables that cause symptoms. For some people this may include tomatoes and tomato products, chili peppers, onions and garlic, and horseradish. Fruits Any fruits prepared with added fat. Any fruits that cause symptoms. For some people this may include citrus fruits, such as oranges, grapefruit, pineapple, and lemons. Meats and other protein foods High-fat meats, such as fatty beef or pork, hot dogs, ribs, ham, sausage, salami and bacon. Fried meat or protein, including fried fish and fried chicken. Nuts and nut butters. Dairy Whole milk and chocolate milk. Sour cream. Cream. Ice cream. Cream cheese. Milk shakes. Beverages Coffee and tea, with or without caffeine. Carbonated beverages. Sodas. Energy drinks. Fruit juice made with acidic fruits (such as orange or grapefruit). Tomato juice. Alcoholic drinks. Fats and oils  Butter. Margarine. Shortening. Ghee. Sweets and desserts Chocolate and cocoa. Donuts. Seasoning and other foods Pepper. Peppermint and spearmint. Any condiments, herbs, or seasonings that cause symptoms. For some people, this may include curry, hot sauce, or vinegar-based salad dressings. Summary  When you have gastroesophageal reflux disease (GERD), food and lifestyle choices are  very important to help ease the discomfort of GERD.  Eat frequent, small meals instead of three large meals each day. Eat your meals slowly, in a relaxed setting. Avoid bending over or lying down until 2-3 hours after eating.  Limit high-fat foods such as fatty meat or fried foods. This information is not intended to replace advice given to you by your health care provider. Make sure you discuss any questions you have with your health care provider. Document Released: 01/08/2005 Document Revised: 01/10/2016 Document Reviewed: 01/10/2016 Elsevier Interactive Patient Education  2019 [employer]

## 2018-07-09 NOTE — Progress Notes (Signed)
Impression and Recommendations:    1. Acute reaction to stress   2. Gastroesophageal reflux disease, esophagitis presence not specified   3. Heartburn   4. Environmental and seasonal allergies    Acute reaction to stress - Plan: FLUoxetine (PROZAC) 10 MG tablet,  -Discussed with patient risk benefits of meds.  Decided to do a trial of this.  We will reassess in 6 days to see if the fluoxetine is helping her stress or not  Gastroesophageal reflux disease, esophagitis presence not specified  - Plan: H. pylori antigen, stool,  - famotidine (PEPCID) 20 MG tablet,  -Discussed with patient intensive dietary lifestyle changes need to be made especially in regards to caffeine, certain trigger foods, alcohol, tobacco, etc.  Handouts were provided.  Heartburn  - Plan: H. pylori antigen, stool, -Patient will bring in stool sample at her convenience in the near future.  Will treat if positive.  Environmental and seasonal allergies  -DC Claritin or other start the Xyzal. -Use sinus rinses twice daily followed by 1 spray Flonase each nostril after the rinses.  This must be done on a daily basis to improve nasal congestion, ear congestion etc. - Plan: levocetirizine (XYZAL) 5 MG tablet,   Orders Placed This Encounter  Procedures  . H. pylori antigen, stool    Meds ordered this encounter  Medications  . FLUoxetine (PROZAC) 10 MG tablet    Sig: Use one half tablet daily for 6 d then increase to 1 tablet daily    Dispense:  30 tablet    Refill:  0  . famotidine (PEPCID) 20 MG tablet    Sig: Take 1 tablet (20 mg total) by mouth 2 (two) times daily.    Dispense:  180 tablet    Refill:  0  . levocetirizine (XYZAL) 5 MG tablet    Sig: Take 1 tablet (5 mg total) by mouth every evening.    Dispense:  90 tablet    Refill:  0    Medications Discontinued During This Encounter  Medication Reason  . fexofenadine-pseudoephedrine (ALLEGRA-D ALLERGY & CONGESTION) 180-240 MG 24 hr tablet  Change in therapy  . pantoprazole (PROTONIX) 40 MG tablet      Gross side effects, risk and benefits, and alternatives of medications and treatment plan in general discussed with patient.  Patient is aware that all medications have potential side effects and we are unable to predict every side effect or drug-drug interaction that may occur.   Patient will call with any questions prior to using medication if they have concerns.    Expresses verbal understanding and consents to current therapy and treatment regimen.  No barriers to understanding were identified.  Red flag symptoms and signs discussed in detail.  Patient expressed understanding regarding what to do in case of emergency\urgent symptoms  Please see AVS handed out to patient at the end of our visit for further patient instructions/ counseling done pertaining to today's office visit.   Return for 6wks- change allergy med, change gerd med, and stool cx H Pylori.     Note:  This note was prepared with assistance of Dragon voice recognition software. Occasional wrong-word or sound-a-like substitutions may have occurred due to the inherent limitations of voice recognition software.  Thomasene Loteborah Cornie Mccomber, DO 07/09/2018 10:57 AM     --------------------------------------------------------------------------------------------------------------------------------------------------------------------------------------------------------------------------------------------    Subjective:     HPI: Donna Contreras is a 38 y.o. female who [facility] for issues  as discussed below.   Last seen March 14, 2018.  At that time she had come concerns about reflux symptoms.  We put her on Protonix 40 mg 1 p.o. daily and she is here for follow-up of that.  Pt has been drinking a lot- came back from the beach recently burning sensation.  Smokes as well.  Drinks a lot of coffee.  - when she does stop  those offending habits, her sx resolve.  Feels like my breath is hot/ blowing fire.   No bad taste in am.     Wt Readings from Last 3 Encounters:  07/09/18 149 lb 6.4 oz (67.8 kg)  03/14/18 143 lb (64.9 kg)  11/26/17 143 lb 4.8 oz (65 kg)   BP Readings from Last 3 Encounters:  07/09/18 114/79  03/14/18 104/69  11/26/17 136/82   Pulse Readings from Last 3 Encounters:  07/09/18 65  03/14/18 72  11/26/17 81   BMI Readings from Last 3 Encounters:  07/09/18 24.86 kg/m  03/14/18 23.80 kg/m  11/26/17 23.85 kg/m     Patient Care Team    Relationship Specialty Notifications Start End  Mellody Dance, DO PCP - General Family Medicine  04/11/17      Patient Active Problem List   Diagnosis Date Noted  . SVT (supraventricular tachycardia) ([place]) 11/30/2013    Priority: High  . Marital disruption involving divorce- her spouse cheated on her. 06/25/2017    Priority: Medium  . High risk heterosexual behavior-  husband cheated on her. 06/25/2017    Priority: Medium  . GERD (gastroesophageal reflux disease) 11/30/2013    Priority: Medium  . 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 h/o Vitamin D deficiency 04/16/2017    Priority: Low  . Environmental and seasonal allergies 04/16/2017    Priority: Low  . H/O bee sting allergy 11/30/2013    Priority: Low  . Multiple closed fractures of ribs of both sides 11/26/2017  . Vaginal itching 03/08/2014  . Left thyroid nodule 11/30/2013  . Seasonal allergic rhinitis 11/30/2013  . Submandibular lymphadenopathy 11/30/2013    Past Medical history, Surgical history, Family history, Social history, Allergies and Medications have been entered into the medical record, reviewed and changed as needed.    Current Meds  Medication Sig  . EPINEPHrine (EPIPEN IJ) Inject as directed. As needed  . loratadine (CLARITIN) 10 MG tablet Take 10 mg by mouth daily.    Allergies:  Allergies  Allergen  Reactions  . Bee Venom Anaphylaxis     Review of Systems:  A fourteen system review of systems was performed and found to be positive as per HPI.   Objective:   Blood pressure 114/79, pulse 65, temperature 98.6 F (37 C), height 5\' 5"  (1.651 m), weight 149 lb 6.4 oz (67.8 kg), last menstrual period 06/25/2018, SpO2 97 %. Body mass index is 24.86 kg/m. General:  Well Developed, well nourished, appropriate for stated age.  Neuro:  Alert and oriented,  extra-ocular muscles intact  HEENT:  Normocephalic, atraumatic, neck supple, no carotid bruits appreciated  Skin:  no gross rash, warm, pink. Cardiac:  RRR, S1 S2 Respiratory:  ECTA B/L and A/P, Not using accessory muscles, speaking in full sentences- unlabored. Vascular:  Ext warm, no cyanosis apprec.; cap RF less 2 sec. Psych:  No HI/SI, judgement and insight good, Euthymic mood. Full Affect.

## 2018-07-14 ENCOUNTER — Telehealth: Payer: Self-pay | Admitting: Family Medicine

## 2018-07-14 NOTE — Telephone Encounter (Signed)
Patient says Donna Contreras called her & left msg to call office.  Ph# [phone]  --glh

## 2018-07-14 NOTE — Telephone Encounter (Signed)
Called and advised patient to get Pepcid OTC since pharmacy was not able to fill. MPulliam, CMA/RT(R)

## 2018-08-20 ENCOUNTER — Encounter: Payer: Self-pay | Admitting: Family Medicine

## 2018-08-20 ENCOUNTER — Other Ambulatory Visit: Payer: Self-pay

## 2018-08-20 ENCOUNTER — Ambulatory Visit (INDEPENDENT_AMBULATORY_CARE_PROVIDER_SITE_OTHER): Payer: BC Managed Care – PPO | Admitting: Family Medicine

## 2018-08-20 VITALS — Ht 65.0 in | Wt 150.0 lb

## 2018-08-20 DIAGNOSIS — Z9114 Patient's other noncompliance with medication regimen: Secondary | ICD-10-CM

## 2018-08-20 DIAGNOSIS — J0141 Acute recurrent pansinusitis: Secondary | ICD-10-CM | POA: Insufficient documentation

## 2018-08-20 DIAGNOSIS — R12 Heartburn: Secondary | ICD-10-CM | POA: Insufficient documentation

## 2018-08-20 DIAGNOSIS — H6983 Other specified disorders of Eustachian tube, bilateral: Secondary | ICD-10-CM

## 2018-08-20 DIAGNOSIS — K219 Gastro-esophageal reflux disease without esophagitis: Secondary | ICD-10-CM | POA: Diagnosis not present

## 2018-08-20 DIAGNOSIS — J3089 Other allergic rhinitis: Secondary | ICD-10-CM | POA: Diagnosis not present

## 2018-08-20 NOTE — Progress Notes (Signed)
Telehealth office visit note for Donna LotDeborah Isel Skufca, D.O- at [facility]   I connected with current patient today and verified that I am speaking with the correct person using two identifiers.   . Location of the patient: Home . Location of the provider: Office Only the patient (+/- their family members at pt's discretion) and myself were participating in the encounter    - This visit type was conducted due to national recommendations for restrictions regarding the COVID-19 Pandemic (e.g. social distancing) in an effort to limit this patient's exposure and mitigate transmission in our community.  This format is felt to be most appropriate for this patient at this time.   - The patient did not have access to video technology or had technical difficulties with video requiring transitioning to audio format only. - No physical exam could be performed with this format, beyond that communicated to us by the patient/ family members as noted.   - Additionally my office staff/ schedulers discussed with the patient that there may be a monetary charge related to this service, depending on their medical insurance.   The patient expressed understanding, and agreed to proceed.      History of Present Illness:   HPI:  Pt presents with Sx since starting antibiotics after having an IUD placed and taking antibiotics.   C/o:  When she started taking antibiotics after her recent placement of IUD, she states subsequently "whatever's going on in my throat, neck, ears, started moving."  States now she's on her last dose of antibiotics, after 7 days of treatment, she is concerned she might need more.  Currently, her ears feel clogged "feels like a drain, but then it clogs back up."  Reiterates that she feels she has drainage that clogs back up.  Additionally, right side, behind her jaw bone, she says her lymph node "comes and goes."  Notes "sometimes it feels big, sometimes it doesn't."  Say  sometimes her lymph nodes feel full, sometimes they don't.  Has Tried:  Patient confirms switching to Xyzal for allergy relief at end of June.  When asked about doing sinus rinses and Flonase in one spray each nostril.  She has been taking the allergy medicine daily, but not the sinus rinses/Flonase.  She has not assessed whether or not she has a fever.   Acid Reflux Notes she's also had throat inflammation due to acid reflux.  Feels managed well on pepcid BID when she eats healthy and cuts back on her alcohol and smoking intake.  Goes away when she cuts out alcohol.        She's been drinking alcohol daily since being at home, and smoking a lot as well as having a poor diet.  She is forgetting her medicines as she written.  Sitting up during sleep, which "kinda feels like it's helping some."    Impression and Recommendations:    1. Acute recurrent pansinusitis   2. Environmental and seasonal allergies   3. Noncompliance with medication regimen   4. Gastroesophageal reflux disease, esophagitis presence not specified   5. Heartburn   6. ETD (Eustachian tube dysfunction), bilateral      - As part of my medical decision making, I reviewed the following data within the electronic MEDICAL RECORD NUMBER History obtained from pt /family, CMA notes reviewed and incorporated if applicable, Labs reviewed, Radiograph/ tests reviewed if applicable and OV notes from prior OV's with me, as well as other specialists  she/he has seen since seeing me last, were all reviewed and used in my medical decision making process today.   - Additionally, discussion had with patient regarding txmnt plan, and their biases/concerns about that plan were used in my medical decision making today.   - The patient agreed with the plan and demonstrated an understanding of the instructions.   No barriers to understanding were identified.   - Red flag symptoms and signs discussed in detail.  Patient expressed understanding regarding  what to do in case of emergency\ urgent symptoms.  The patient was advised to call back or seek an in-person evaluation if the symptoms worsen or if the condition fails to improve as anticipated.   Acute Recurrent Pansinusitis, ETD, Environmental & Seasonal Allergies - Viral vs Allergic vs Bacterial causes for pt's symptoms reveiwed.    - Supportive care and various OTC medications discussed in addition to any prescribed.    - Patient with noncompliance of allergy meds.  - Advised the patient to begin using AYR or Neilmed sinus rinses BID followed by flonase BID (one spray to each nostril).    - Advised that the patient may also incorporate allegra or claritin PRN.   - Advised patient that after exposure to known allergens, she should rinse her sinuses.  - Advised patient to be consistent with her allergy medication, Flonase, and sinus rinses 2/daily.  -Patient requested she go to an allergist for further assessment.  I suggested patient be consistent with her medication regiment and if after several weeks of being consistent, she still has symptoms, then we will refer her.   GERD & Heartburn - Patient confirms currently managed on Pepcid BID.  See med list.  -Patient requested ENT referral and we discussed referral to ENT for her acid reflux concerns. - Ear Nose & Throat Consult referral provided today. - Advised patient to ask ENT about the relation of her postnasal drip due to chronic sinusitis and GERD/reflux that is likely contributing to her sore throat and her feeling like her glands are swollen from time to time and throat swollen from time to time etc.  - Patient agrees to avoid caffeine, alcohol, smoking, trigger foods, and other causes of reflux. - Extensive education was provided and all questions were answered.  - Patient will bring stool in for testing for H. Pylori.  - If all pending results come back negative, the next step will be to send patient to GI.  - Patient  knows she may call in PRN with concerns or if her symptoms change.  - Call or RTC if new symptoms, or if no improvement or worse over next several days.   Return for for chronic care as discussed prior and prn if sx NI.    Orders Placed This Encounter  Procedures  . Ambulatory referral to ENT    Medications Discontinued During This Encounter  Medication Reason  . loratadine (CLARITIN) 10 MG tablet       I provided 20++ minutes of non-face-to-face time during this encounter,with over 50% of the time in direct counseling on patients medical conditions/ medical concerns.  Additional time was spent with charting and coordination of care after the actual visit commenced.   Note:  This note was prepared with assistance of Dragon voice recognition software. Occasional wrong-word or sound-a-like substitutions may have occurred due to the inherent limitations of voice recognition software.  This document serves as a record of services personally performed by Mellody Dance, DO. It was created  on her behalf by Peggye FothergillKatherine Galloway, a trained medical scribe. The creation of this record is based on the scribe's personal observations and the provider's statements to them.   I have reviewed the above medical documentation for accuracy and completeness and I concur.  Donna Loteborah Delorese Sellin, DO 08/20/2018 8:38 PM     Patient Care Team    Relationship Specialty Notifications Start End  Donna Contreras, Jakevious Hollister, DO PCP - General Family Medicine  04/11/17     -Vitals obtained; medications/ allergies reconciled;  personal medical, social, Sx etc.histories were updated by CMA, reviewed by me and are reflected in chart   Patient Active Problem List   Diagnosis Date Noted  . SVT (supraventricular tachycardia) ([facility]) 11/30/2013    Priority: High  . Marital disruption involving divorce- her spouse cheated on her. 06/25/2017    Priority: Medium  . High risk heterosexual behavior-  husband cheated on her.  06/25/2017    Priority: Medium  . GERD (gastroesophageal reflux disease) 11/30/2013    Priority: Medium  . 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 h/o Vitamin D deficiency 04/16/2017    Priority: Low  . Environmental and seasonal allergies 04/16/2017    Priority: Low  . H/O bee sting allergy 11/30/2013    Priority: Low  . Acute recurrent pansinusitis 08/20/2018  . Heartburn 08/20/2018  . Multiple closed fractures of ribs of both sides 11/26/2017  . Vaginal itching 03/08/2014  . Left thyroid nodule 11/30/2013  . Seasonal allergic rhinitis 11/30/2013  . Submandibular lymphadenopathy 11/30/2013     Current Meds  Medication Sig  . EPINEPHrine (EPIPEN IJ) Inject as directed. As needed  . famotidine (PEPCID) 20 MG tablet Take 1 tablet (20 mg total) by mouth 2 (two) times daily.  Marland Kitchen. FLUoxetine (PROZAC) 10 MG tablet Use one half tablet daily for 6 d then increase to 1 tablet daily  . fluticasone (FLONASE) 50 MCG/ACT nasal spray 1 spray each nostril following sinus rinses twice daily  . levocetirizine (XYZAL) 5 MG tablet Take 1 tablet (5 mg total) by mouth every evening.  . [DISCONTINUED] loratadine (CLARITIN) 10 MG tablet Take 10 mg by mouth daily.     Allergies:  Allergies  Allergen Reactions  . Bee Venom Anaphylaxis    ROS:  See above HPI for pertinent positives and negatives   Objective:   Height 5\' 5"  (1.651 m), weight 150 lb (68 kg), last menstrual period 08/06/2018.  (if some vitals are omitted, this means that patient was UNABLE to obtain them even though they were asked to get them prior to OV today.  They were asked to call us at their earliest convenience with these once obtained. )  General: A & O * 3; sounds in no acute distress; in usual state of health.  Skin: Pt confirms warm and dry extremities and pink fingertips HEENT: Pt confirms lips non-cyanotic Chest: Patient confirms normal chest excursion and movement  Respiratory: speaking in full sentences, no conversational dyspnea; patient confirms no use of accessory muscles Psych: insight appears good, mood- appears full

## 2018-08-22 LAB — H. PYLORI ANTIGEN, STOOL: H pylori Ag, Stl: NEGATIVE

## 2018-08-25 ENCOUNTER — Telehealth: Payer: Self-pay | Admitting: Family Medicine

## 2018-08-25 NOTE — Telephone Encounter (Signed)
Patient called states she brought by a specimen last Thursday & wondered if results were in.-- forwarding request for information to medical assistant.  -glh

## 2018-08-25 NOTE — Telephone Encounter (Signed)
Please see result note in chart. MPulliam, CMA/RT(R)

## 2018-08-28 DIAGNOSIS — K219 Gastro-esophageal reflux disease without esophagitis: Secondary | ICD-10-CM | POA: Insufficient documentation

## 2018-08-28 DIAGNOSIS — J3089 Other allergic rhinitis: Secondary | ICD-10-CM | POA: Insufficient documentation

## 2018-10-07 ENCOUNTER — Other Ambulatory Visit: Payer: Self-pay | Admitting: Family Medicine

## 2018-10-07 DIAGNOSIS — J3089 Other allergic rhinitis: Secondary | ICD-10-CM

## 2018-10-07 MED ORDER — LEVOCETIRIZINE DIHYDROCHLORIDE 5 MG PO TABS: 5.0000 mg | ORAL_TABLET | Freq: Every evening | ORAL | 0 refills | Status: DC

## 2018-10-07 NOTE — Telephone Encounter (Signed)
Patient called requested refill on :   levocetirizine (XYZAL) 5 MG tablet [[id number]]   Order Details Dose: 5 mg Route: Oral Frequency: Every evening  Dispense Quantity: 90 tablet Refills: 0 Fills remaining: --        Sig: Take 1 tablet (5 mg total) by mouth every evening.     ---Forwarding request to med asst that if approved send refill order to :   [place]  -------- Patient also states terrible intergestion & wishes to be referred to a GI specialist.  --Please call pt @ [phone] if any questions or concerns.  --glh

## 2018-10-10 ENCOUNTER — Encounter: Payer: Self-pay | Admitting: Gastroenterology

## 2018-10-10 ENCOUNTER — Telehealth: Payer: Self-pay | Admitting: Family Medicine

## 2018-10-10 DIAGNOSIS — K219 Gastro-esophageal reflux disease without esophagitis: Secondary | ICD-10-CM

## 2018-10-10 NOTE — Telephone Encounter (Signed)
Referral placed please advise

## 2018-10-10 NOTE — Telephone Encounter (Signed)
I see about a ENT referral for GERD but not GI please advise

## 2018-10-10 NOTE — Telephone Encounter (Signed)
Yes per my last notes under her reflux/heartburn and I literally said ENT referral and that she was going to be evaluated by them first to see if that was causing her problems.  Never told her last time she was going to go to gastroenterology last office visit in a form of a referral.  -If ear nose and throat docs feel that her symptoms are not related to sinuses then, next step would be GI.  However I have not discussed her ENT visit with her or seeing how her symptoms are.

## 2018-10-10 NOTE — Telephone Encounter (Signed)
Patient called to check status of Gastroenterologist referral --advised provider has previous note &  A note will be sent as follow up--glh

## 2018-10-16 ENCOUNTER — Other Ambulatory Visit: Payer: Self-pay

## 2018-10-16 ENCOUNTER — Ambulatory Visit (INDEPENDENT_AMBULATORY_CARE_PROVIDER_SITE_OTHER): Payer: BC Managed Care – PPO | Admitting: Family Medicine

## 2018-10-16 ENCOUNTER — Encounter: Payer: Self-pay | Admitting: Family Medicine

## 2018-10-16 VITALS — BP 118/74 | HR 63 | Temp 98.8°F | Ht 65.0 in | Wt 160.8 lb

## 2018-10-16 DIAGNOSIS — J3089 Other allergic rhinitis: Secondary | ICD-10-CM | POA: Diagnosis not present

## 2018-10-16 DIAGNOSIS — R0982 Postnasal drip: Secondary | ICD-10-CM | POA: Insufficient documentation

## 2018-10-16 DIAGNOSIS — Z8349 Family history of other endocrine, nutritional and metabolic diseases: Secondary | ICD-10-CM

## 2018-10-16 DIAGNOSIS — Z23 Encounter for immunization: Secondary | ICD-10-CM

## 2018-10-16 DIAGNOSIS — J0141 Acute recurrent pansinusitis: Secondary | ICD-10-CM | POA: Diagnosis not present

## 2018-10-16 DIAGNOSIS — F4322 Adjustment disorder with anxiety: Secondary | ICD-10-CM

## 2018-10-16 DIAGNOSIS — E041 Nontoxic single thyroid nodule: Secondary | ICD-10-CM

## 2018-10-16 DIAGNOSIS — R221 Localized swelling, mass and lump, neck: Secondary | ICD-10-CM | POA: Diagnosis not present

## 2018-10-16 DIAGNOSIS — F430 Acute stress reaction: Secondary | ICD-10-CM

## 2018-10-16 DIAGNOSIS — K219 Gastro-esophageal reflux disease without esophagitis: Secondary | ICD-10-CM

## 2018-10-16 MED ORDER — FLUOXETINE HCL 20 MG PO TABS: ORAL_TABLET | ORAL | 1 refills | Status: DC

## 2018-10-16 MED ORDER — AZELASTINE HCL 0.1 % NA SOLN: 1.0000 | Freq: Two times a day (BID) | NASAL | 11 refills | Status: AC

## 2018-10-16 NOTE — Patient Instructions (Signed)
  What is Chronic Stress Syndrome, Symptoms & Ways to Deal With it   What is Chronic Stress Syndrome?  Chronic Stress Syndrome is something which can now be called as a medical condition due to the amount of stress an individual is going through these days. Chronic Stress Syndrome causes the body and mind to shutdown and the person has no control over himself or herself. Due to the demands of modern day life and the hardship throughout day and night takes its toll over a period of time and the body and brain starts demanding rest and a break. This leads to certain symptoms where your performance level starts to dip at work, you become irritable both at work and at home, you may stop enjoying activities you previously liked, you may become depressed, you may get angry for even small things. Chronic Stress Syndrome can significantly impact your quality life. Thus it is important understand the symptoms of Chronic Stress Syndrome and react accordingly in order to cope up with it.  It is important to note here that a balanced work-home equation should be drawn to cut down symptoms of Chronic Stress Syndrome. Minor stressors can be overcome by the body's inbuilt stress response but when there is unending stress for a long period of time then an external help is required to ease the stress.  Chronic Stress Syndrome can physically and psychologically drain you over a period of time. For such cases stress management is the best way to cope up with Chronic Stress Syndrome. If Chronic Stress Syndrome is not treated then it may result in many health hazards like anxiety, muscle pain, insomnia, and high blood pressure along with a compromised immune system leading to frequent infections and missed days from work.    What are the Symptoms of Chronic Stress Syndrome?   The symptoms of Chronic Stress Syndrome are variable and range from generalized symptoms to emotional symptoms along with behavioral and  cognitive symptoms. Some of these symptoms have been delineated below:  Generalized Symptoms of Chronic Stress Syndrome are: Anxiety Depression Social isolation Headache Abdominal pain Lack of sleep Back pain Difficulty in concentrating Hypertension Hemorrhoids Varicose veins Panic attacks/ Panic disorder Cardiovascular diseases.   Some of the Emotional Symptoms of Chronic Stress Syndrome are: To become easily agitated, moody and frustrated Feeling overwhelmed which makes you feel like you are losing control. Having difficulty relaxing and have a peaceful mind Having low self esteem Feeling lonely Feeling worthless Feeling depressed Avoiding social environment.   Some of the Physical Symptoms of Chronic Stress Syndrome are: Headaches Lethargy Alternating diarrhea and constipation Nausea Muscles aches and pains Insomnia Rapid heartbeat and chest pain Infections and frequent colds Decreased libido Nervousness and shaking Tinnitus Sweaty palms Dry mouth Clenched jaw.  Some of the Cognitive Symptoms of Chronic Stress Syndrome are: Constant worrying Racing thoughts Disorganization and forgetfulness Inability to focus Poor judgment Abundance of negativity.  Some of the Behavioral Symptoms of Chronic Stress Syndrome are: Changes in appetite with less desire to eat Avoiding responsibilities Indulgence in alcohol or recreational drug use Increased nail biting and being fidgety Ways to Deal With Chronic Stress Syndrome    Chronic Stress Syndrome is not something which cannot be addressed. A bit of effort from your side in the form of lifestyle modifications, a little bit of exercise, a balanced work life equation can do wonders and help you get rid of Chronic Stress Syndrome.  Get Proper Sleep: It has been proved that Chronic   Stress Syndrome causes loss of sleep where an individual may not even be able to sleep for days unending. This may result in the  individual feeling lethargic and unable to focus at work the following morning. This may lead to decreased performance at work. Thus, it is important to have a good sleep-wake cycle. For this, try and not drink any caffeinated beverage about four hours prior to going to sleep, as caffeine pumps up the adrenaline and causes you to stay awake resulting ultimately in Chronic Stress Syndrome.  Avoid Alcohol and Drugs: Another way to get rid of Chronic Stress Syndrome is lifestyle modifications. Stay away from alcohol and other recreational drugs. Take Short Frequent Breaks at Work: Try to take frequent breaks from work and do not work continuously. Try and manage your work in such a way that you even meet your deadline and come home on time for a happy dinner with family. A good time spent with family and kids does wonders in not only dealing with Chronic Stress Syndrome but also preventing it.  Become Physically Active: Another step towards getting rid of Chronic Stress Syndrome is physical activity. If you do not have time to spend at the gym then at least try and go for daily walks for about half an hour a day which not only keeps the stress away but also is good for your overall health. Physical activity leads to production of endorphins which will make you feel relaxed and feel good.  Healthy Diet Can Help You Deal With Chronic Stress Syndrome: Have a balanced and healthy diet is another step towards a stress free life and keeping Chronic Stress Syndrome at bay. If time is a constraint then you can try eating three small meals a day. Try and avoid fast foods and take foods which are healthy and rich in proteins, fiber, and carbohydrates to boost your energy system.  Music Can Soothe Your Mind: Light music is one of the best and most effective relaxation techniques that one can try to overcome stress. It has shown to calm down the mind and take you away from all the stressors that you may be having. These  days it is also being used as a therapy in some institutes for overcoming stress. It is important here to discuss the importance of a good social support system for patients with Chronic Stress Syndrome, as a good social support framework can do wonders in taking the stress away from the patient and overcoming Chronic Stress Syndrome.  Meditation Can Help You Deal With Chronic Stress Syndrome Effectively: Meditation and yoga has also shown to be quite effective in relaxing the mind and coping up with Chronic Stress Syndrome   In cases where these measures are not helpful, then it is time for you to consult with a skilled psychologist or a psychiatrist for potential therapies or medications to control the stress response.   The psychologist can help you with a variety of steps for coping up with Chronic Stress Syndrome. Relaxation techniques and behavioral therapy are some of the methods employed by psychologists. In some cases, medications can also be given to help relax the patient.  Since Chronic Stress Syndrome is both emotionally and physically draining for the patient and it also adversely affects the family life of the patient hence it is important for the patient to recognize the condition and taking steps to cope up with it. Escaping measures like alcohol and drug use are of no help as they only   aggravate the condition apart from their other health hazards. If this condition is ignored or left untreated it can lead to various medical conditions like anxiety and depression and various other medical conditions.  Last but not least, smile as often as you can as it is the best gift that you can give to someone. The best way to stay relaxed is to have a good smile, exercise daily, spend time with your family, meditation and if required consultation with a good psychologist so that you can live a stress free life and overcome the symptoms of Chronic Stress Syndrome. 

## 2018-10-16 NOTE — Progress Notes (Addendum)
Impression and Recommendations:    1. Acute recurrent pansinusitis   2. Environmental and seasonal allergies   3. PND (post-nasal drip)   4. sensation of R sided Lump in neck/throat   5. Family history of Graves' disease   6. Left thyroid nodule   7. Adjustment reaction with anxiety   8. Gastroesophageal reflux disease, esophagitis presence not specified   9. Need for influenza vaccination   10. Acute reaction to stress     - Need for influenza vaccination.  Chronic Concerns - Acute Recurrent Pansinusitis, Post-Nasal Drip - Lengthy discussion held with patient regarding possible causes of sx. - Health counseling was provided and all questions were answered.  - Discussed that post nasal drip may be causing the patient to feel some of the sensations she describes.  Encouraged patient to continue sinus rinses, Flonase, and Xyzal.   - Advised pt to discontinue OTC cold meds.  - Astelin added to treatment plan.  See med list. - Otherwise, advised pt to continue allergy treatment as established.  - Continue AYR or Neilmed sinus rinses BID followed by sinus sprays (Flonase, Astelin) BID (one spray to each nostril).  - Will continue to monitor.  Sensation of R-sided Lump in Neck/Throat - Extensively explained possible causes of pt sx.  - Need to rule out abscess/mass. - Ultrasound ordered today to evaluate R-sided sensation of lump in neck.  - Will continue to monitor.  Adjustment Reaction w/ Anxiety - Extensively discussed symptoms of anxiety with patient today. - Explained that anxiety can sometimes lead to psychophysiological sensations. - Discussed beginning prescription mood medication today. - Education provided to pt regarding resuming Prozac today.  All questions answered.  - Patient agrees to begin medication today. See med list. - Discussed tapering up dose from low to full strength.  - In addition to prescription intervention, reviewed the "spokes of the  wheel" of mood and health management.  Stressed the importance of ongoing prudent habits, including regular exercise, appropriate sleep hygiene, healthful dietary habits, and prayer/meditation to relax.  - Encouraged patient to reduce alcohol consumption and tobacco use.  - Will continue to monitor.  Female Health - Per pt, recently had IUD removed. - Encouraged patient to continue to follow up with OBGYN as established. - Will continue to monitor.  Health Counseling & Preventative Health Maintenance - Advised patient to continue working toward exercising to improve overall mental, physical, and emotional health.    - Encouraged patient to engage in daily physical activity, especially a formal exercise routine.  Recommended that the patient eventually strive for at least 150 minutes of moderate cardiovascular activity per week according to guidelines established by the [facility].   - Healthy dietary habits encouraged, including low-carb, and high amounts of lean protein in diet.   - Patient should also consume adequate amounts of water.  - Health counseling performed.  All questions answered.  Recommendations - Discussed need for physical exam and full fasting lab work in near future. - 2 months follow up from starting new meds; Prozac and Astelin.  - Encouraged pt to continue to follow up with GI and ENT as referred. - Encouraged patient to be honest with all specialists about both her concerns and anxiety.  Pt was interviewed and evaluated by me in the clinic today for 32.5+ minutes, with over 50% time spent in face to face counseling of patients various medical conditions, treatment plans of those medical conditions including medicine management and lifestyle modification,  strategies to improve health and well being; and in coordination of care. SEE ABOVE TREATMENT PLAN FOR DETAILS    Orders Placed This Encounter  Procedures   US SOFT TISSUE HEAD & NECK (NON-THYROID)   Flu Vaccine  QUAD 6+ mos PF IM (Fluarix Quad PF)    Meds ordered this encounter  Medications   azelastine (ASTELIN) 0.1 % nasal spray    Sig: Place 1 spray into both nostrils 2 (two) times daily. 1 spray each nostril twice daily after sinus rinses    Dispense:  30 mL    Refill:  11   FLUoxetine (PROZAC) 20 MG tablet    Sig: 1 po qd    Dispense:  90 tablet    Refill:  1    Medications Discontinued During This Encounter  Medication Reason   FLUoxetine (PROZAC) 10 MG tablet Patient Preference   metroNIDAZOLE (FLAGYL) 500 MG tablet Completed Course     Gross side effects, risk and benefits, and alternatives of medications and treatment plan in general discussed with patient.  Patient is aware that all medications have potential side effects and we are unable to predict every side effect or drug-drug interaction that may occur.   Patient will call with any questions prior to using medication if they have concerns.    Expresses verbal understanding and consents to current therapy and treatment regimen.  No barriers to understanding were identified.  Red flag symptoms and signs discussed in detail.  Patient expressed understanding regarding what to do in case of emergency\urgent symptoms  Please see AVS handed out to patient at the end of our visit for further patient instructions/ counseling done pertaining to today's office visit.   No follow-ups on file.     Note:  This note was prepared with assistance of Dragon voice recognition software. Occasional wrong-word or sound-a-like substitutions may have occurred due to the inherent limitations of voice recognition software.   This document serves as a record of services personally performed by Thomasene Loteborah Opalski, DO. It was created on her behalf by Peggye FothergillKatherine Galloway, a trained medical scribe. The creation of this record is based on the scribe's personal observations and the provider's statements to them.   I have reviewed the above medical  documentation for accuracy and completeness and I concur.  Thomasene Loteborah Opalski, DO 10/16/2018 1:24 PM        --------------------------------------------------------------------------------------------------------------------------------------------------------------------------------------------------------------------------------------------    Subjective:     HPI: Denver FasterJustine Contreras is a 38 y.o. female who [facility] issues as discussed below.  - Throat & Sinus Concerns Patient says follow-up with "ENT said nothing."  "He did check and 'said he didn't see anything, he looked through the inside of my mouth and checked my neck or whatever.'"  Says she has been taking OTC cold and sinus medications, "but it feels like it's like draining, but then it feels hot and like it's still there."  Describes something about her "glands" and says "I feel like there's something infected, because it goes up into my ear."  Continues to state when she moves "I can feel something going into my ear."  States she's taking a nasal spray daily "whatever one you prescribed me, twice a day."  Confirms taking Flonase and Xyzal.  - Lower Extremity Swelling Got her IUD taken out yesterday "because I swear ever since I had it placed I've had all these health issues."  Says "the past couple weeks, my legs have been  swelling, I've not felt well, I've had a pain in my ribs."  Says "my dad noticed that my ankles were swollen a couple weeks ago."  Says the swelling "comes and goes."  The past couple weeks, states she's been drinking a lot of water.  Says she's been backing off of alcohol use, "I've been trying."  Denies eating fast food, but does not confirm or deny eating more take out.  Notes hasn't been back to the heart doctor in a long time.  She is not exercising daily.  - GERD Continues taking Pepcid for GERD.  States it works for her when she is otherwise  being prudent and doesn't drink too much alcohol, caffeine, smoke cigarettes, etc.  Says "I can still feel it right now, a little bit of burning and hotness."  "I just feel like something is infected back there, because that's how it feels."  Says when she takes the OTC cold meds, "it feels like it drains but then it comes back up."  - Mood Management Regarding prescription intervention for anxiety, states "I was going to take it as I needed it."    Wt Readings from Last 3 Encounters:  10/16/18 160 lb 12.8 oz (72.9 kg)  08/20/18 150 lb (68 kg)  07/09/18 149 lb 6.4 oz (67.8 kg)   BP Readings from Last 3 Encounters:  10/16/18 118/74  07/09/18 114/79  03/14/18 104/69   Pulse Readings from Last 3 Encounters:  10/16/18 63  07/09/18 65  03/14/18 72   BMI Readings from Last 3 Encounters:  10/16/18 26.76 kg/m  08/20/18 24.96 kg/m  07/09/18 24.86 kg/m   Depression screen [facility] 2/9 10/16/2018 08/20/2018 07/09/2018  Decreased Interest 0 0 0  Down, Depressed, Hopeless 0 0 0  PHQ - 2 Score 0 0 0  Altered sleeping 0 0 0  Tired, decreased energy 0 0 0  Change in appetite 0 0 0  Feeling bad or failure about yourself  0 0 0  Trouble concentrating 0 0 0  Moving slowly or fidgety/restless 0 0 0  Suicidal thoughts 0 0 0  PHQ-9 Score 0 0 0  Difficult doing work/chores - Not difficult at all Not difficult at all   GAD 7 : Generalized Anxiety Score 08/20/2018  Nervous, Anxious, on Edge 0  Control/stop worrying 0  Worry too much - different things 0  Trouble relaxing 0  Restless 0  Easily annoyed or irritable 0  Afraid - awful might happen 0  Total GAD 7 Score 0  Anxiety Difficulty Not difficult at all      Patient Care Team    Relationship Specialty Notifications Start End  Mellody Dance, DO PCP - General Family Medicine  04/11/17      Patient Active Problem List   Diagnosis Date Noted   sensation of R sided Lump in neck/throat 10/16/2018   Family history of Graves' disease  10/16/2018   PND (post-nasal drip) 10/16/2018   Adjustment reaction with anxiety 10/16/2018   Acute recurrent pansinusitis 08/20/2018   Heartburn 08/20/2018   Multiple closed fractures of ribs of both sides 11/26/2017   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 06/25/2017   High risk heterosexual behavior-  husband cheated on her. 06/25/2017   h/o Vitamin D deficiency 04/16/2017   Environmental and seasonal allergies 04/16/2017   Vaginal itching 03/08/2014   Left thyroid nodule 11/30/2013   GERD (gastroesophageal reflux disease) 11/30/2013  Seasonal allergic rhinitis 11/30/2013   SVT (supraventricular tachycardia) (HCC) 11/30/2013   H/O bee sting allergy 11/30/2013   Submandibular lymphadenopathy 11/30/2013    Past Medical history, Surgical history, Family history, Social history, Allergies and Medications have been entered into the medical record, reviewed and changed as needed.    Current Meds  Medication Sig   EPINEPHrine (EPIPEN IJ) Inject as directed. As needed   famotidine (PEPCID) 20 MG tablet Take 1 tablet (20 mg total) by mouth 2 (two) times daily.   fluticasone (FLONASE) 50 MCG/ACT nasal spray 1 spray each nostril following sinus rinses twice daily   levocetirizine (XYZAL) 5 MG tablet Take 1 tablet (5 mg total) by mouth every evening.    Allergies:  Allergies  Allergen Reactions   Bee Venom Anaphylaxis     Review of Systems:  A fourteen system review of systems was performed and found to be positive as per HPI.   Objective:   Blood pressure 118/74, pulse 63, temperature 98.8 F (37.1 C), temperature source Oral, height 5\' 5"  (1.651 m), weight 160 lb 12.8 oz (72.9 kg), last menstrual period 09/07/2018, SpO2 99 %. Body mass index is 26.76 kg/m. General:  Well Developed, well nourished, appropriate for stated age.  Neuro:  Alert and oriented,   extra-ocular muscles intact  HEENT:  Normocephalic, atraumatic, neck supple, no carotid bruits appreciated  Skin:  no gross rash, warm, pink. Cardiac:  RRR, S1 S2 Respiratory:  ECTA B/L and A/P, Not using accessory muscles, speaking in full sentences- unlabored. Vascular:  Ext warm, no cyanosis apprec.; cap RF less 2 sec. Psych:  No HI/SI, judgement and insight good, Euthymic mood. Full Affect. Bilateral Lower Extremity:  No edema appreciated in legs or ankles.

## 2018-10-17 ENCOUNTER — Ambulatory Visit: Payer: BC Managed Care – PPO | Admitting: Family Medicine

## 2018-10-24 ENCOUNTER — Other Ambulatory Visit: Payer: BC Managed Care – PPO

## 2018-10-27 ENCOUNTER — Other Ambulatory Visit: Payer: BC Managed Care – PPO

## 2018-11-04 ENCOUNTER — Other Ambulatory Visit: Payer: Self-pay

## 2018-11-04 ENCOUNTER — Encounter: Payer: Self-pay | Admitting: Emergency Medicine

## 2018-11-04 ENCOUNTER — Ambulatory Visit
Admission: EM | Admit: 2018-11-04 | Discharge: 2018-11-04 | Disposition: A | Discharge status: Home / Self Care | Payer: BC Managed Care – PPO | Attending: Physician Assistant | Admitting: Physician Assistant

## 2018-11-04 DIAGNOSIS — R1011 Right upper quadrant pain: Secondary | ICD-10-CM | POA: Diagnosis not present

## 2018-11-04 LAB — POCT URINALYSIS DIP (MANUAL ENTRY)
Bilirubin, UA: NEGATIVE
Glucose, UA: NEGATIVE mg/dL
Ketones, POC UA: NEGATIVE mg/dL
Leukocytes, UA: NEGATIVE
Nitrite, UA: NEGATIVE
Protein Ur, POC: NEGATIVE mg/dL
Spec Grav, UA: 1.015 (ref 1.010–1.025)
Urobilinogen, UA: 0.2 E.U./dL
pH, UA: 6.5 (ref 5.0–8.0)

## 2018-11-04 NOTE — ED Triage Notes (Addendum)
Pt presents to [facility] for assessment of 2 days of RUQ pain, intermittent in nature.  C/o her entire upper body feeling "swollen".  C/o headaches.  C/o softer stools than normal.  Denies pattern to pain.  Patient to see GI doctor Monday for reflux issues.  Patient c/o headaches as well.  Patient also c/o nasal congestion and "swelling" behind her tongue, which she has been seen for before.  Pt states she had her IUD removed two weeks ago,  Menstrual cycle last week, shorter than normal.

## 2018-11-04 NOTE — ED Provider Notes (Signed)
[facility]    CSN: [id number] Arrival date & time: 11/04/18  1454      History   Chief Complaint Chief Complaint  Patient presents with  . Abdominal Pain    HPI Donna Contreras is a 38 y.o. female.   38 year old female comes in for 2-day history of intermittent right upper quadrant pain.  States can just feel that it is "there" and last for few minutes.  Unable to describe pain.  Does feel that the region is "swollen".  Denies nausea or vomiting.  Does have history of GERD, with burning sensation to the chest, for which she has a GI appointment in a week.  She denies any worsening of right upper quadrant pain during GERD symptoms.  Having softer stools, no straining.  No melena, hematochezia.  Pain is not associated with oral intake.  Denies fever, chills, body aches.  Does complain of urinary frequency without dysuria or hematuria.  Denies vaginal discharge, itching, bleeding.  LMP 10/28/2018.  IUD removed 2 weeks ago due to side effect of medication.  Patient is currently asymptomatic.  Patient does have headaches, rhinorrhea/nasal congestion, globus sensation.  This is chronic, followed by PCP, no changes/worsening of symptoms.  Denies coughing, shortness of breath, loss of taste/smell.      Past Medical History:  Diagnosis Date  . History of cardiac murmur   . History of chicken pox   . History of gastroesophageal reflux (GERD)   . History of hay fever   . History of thyroid disease   . Hx of dysplastic nevus 2016   multiple sites   . SVT (supraventricular tachycardia) (HCC)    as a teen  . Thyroid nodule    left    Patient Active Problem List   Diagnosis Date Noted  . sensation of R sided Lump in neck/throat 10/16/2018  . Family history of Graves' disease 10/16/2018  . PND (post-nasal drip) 10/16/2018  . Adjustment reaction with anxiety 10/16/2018  . Acute recurrent pansinusitis 08/20/2018  . Heartburn 08/20/2018  . Multiple closed fractures of  ribs of both sides 11/26/2017  . 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 Marital disruption involving divorce- her spouse cheated on her. 06/25/2017  . High risk heterosexual behavior-  husband cheated on her. 06/25/2017  . h/o Vitamin D deficiency 04/16/2017  . Environmental and seasonal allergies 04/16/2017  . Vaginal itching 03/08/2014  . Left thyroid nodule 11/30/2013  . GERD (gastroesophageal reflux disease) 11/30/2013  . Seasonal allergic rhinitis 11/30/2013  . SVT (supraventricular tachycardia) (HCC) 11/30/2013  . H/O bee sting allergy 11/30/2013  . Submandibular lymphadenopathy 11/30/2013    Past Surgical History:  Procedure Laterality Date  . CARDIAC ELECTROPHYSIOLOGY STUDY AND ABLATION    . TONSILLECTOMY AND ADENOIDECTOMY    . WISDOM TOOTH EXTRACTION      OB History   No obstetric history on file.      Home Medications    Prior to Admission medications   Medication Sig Start Date End Date Taking? Authorizing Provider  azelastine (ASTELIN) 0.1 % nasal spray Place 1 spray into both nostrils 2 (two) times daily. 1 spray each nostril twice daily after sinus rinses 10/16/18   Opalski, Deborah, DO  EPINEPHrine (EPIPEN IJ) Inject as directed. As needed    [provider]  famotidine (PEPCID) 20 MG tablet Take 1 tablet (20 mg total) by mouth 2 (two) times daily. 07/09/18   Opalski, Deborah, DO  fluticasone (FLONASE) 50 MCG/ACT nasal  spray 1 spray each nostril following sinus rinses twice daily 03/14/18   Opalski, Gavin [place]ound[place]orah, DO  levocetirizine (XYZAL) 5 MG tablet Take 1 tablet (5 mg total) by mouth every evening. 10/07/18   Thomasene Lotpalski, Deborah, DO  FLUoxetine (PROZAC) 20 MG tablet 1 po qd 10/16/18 11/04/18  Thomasene Lotpalski, Deborah, DO    Family History Family History  Problem Relation Age of Onset  . Heart attack Maternal Grandfather   . Arthritis Maternal Grandmother   . Stroke Paternal Grandmother   . Diabetes Father   . Skin cancer Paternal  Grandfather     Social History Social History   Tobacco Use  . Smoking status: Former Smoker    Packs/day: 0.25    Quit date: 2014    Years since quitting: 6.7  . Smokeless tobacco: Never Used  Substance Use Topics  . Alcohol use: Yes    Alcohol/week: 5.0 standard drinks    Types: 5 Standard drinks or equivalent per week    Comment: socially-weekends  . Drug use: No     Allergies   Bee venom   Review of Systems Review of Systems  Reason unable to perform ROS: See HPI as above.     Physical Exam Triage Vital Signs ED Triage Vitals [11/04/18 1510]  Enc Vitals Group     BP 133/86     Pulse Rate 68     Resp 20     Temp 97.9 F (36.6 C)     Temp Source Oral     SpO2 98 %     Weight      Height      Head Circumference      Peak Flow      Pain Score 0     Pain Loc      Pain Edu?      Excl. in GC?    No data found.  Updated Vital Signs BP 133/86 (BP Location: Left Arm)   Pulse 68   Temp 97.9 F (36.6 C) (Oral)   Resp 20   LMP 10/28/2018   SpO2 98%   Physical Exam Constitutional:      General: She is not in acute distress.    Appearance: She is well-developed. She is not ill-appearing, toxic-appearing or diaphoretic.  HENT:     Head: Normocephalic and atraumatic.  Eyes:     Conjunctiva/sclera: Conjunctivae normal.     Pupils: Pupils are equal, round, and reactive to light.  Cardiovascular:     Rate and Rhythm: Normal rate and regular rhythm.     Heart sounds: Normal heart sounds. No murmur. No friction rub. No gallop.   Pulmonary:     Effort: Pulmonary effort is normal.     Breath sounds: Normal breath sounds. No wheezing or rales.  Abdominal:     General: Abdomen is flat. Bowel sounds are normal.     Palpations: Abdomen is soft.     Tenderness: There is no abdominal tenderness. There is no right CVA tenderness, left CVA tenderness, guarding or rebound. Negative signs include Murphy's sign.     Hernia: No hernia is present.  Skin:    General:  Skin is warm and dry.  Neurological:     Mental Status: She is alert and oriented to person, place, and time.  Psychiatric:        Behavior: Behavior normal.        Judgment: Judgment normal.    UC Treatments / Results  Labs (all labs ordered are listed, but  only abnormal results are displayed) Labs Reviewed  POCT URINALYSIS DIP (MANUAL ENTRY) - Abnormal; Notable for the following components:      Result Value   Clarity, UA hazy (*)    Blood, UA small (*)    All other components within normal limits    EKG   Radiology No results found.  Procedures Procedures (including critical care time)  Medications Ordered in UC Medications - No data to display  Initial Impression / Assessment and Plan / UC Course  I have reviewed the triage vital signs and the nursing notes.  Pertinent labs & imaging results that were available during my care of the patient were reviewed by me and considered in my medical decision making (see chart for details).    No alarming signs on exam.  Patient currently asymptomatic.  Abdomen nontender to palpation.  Urine with small blood, no CVA tenderness, will have patient recheck urine during PCP appointment in 2 weeks.  At this time, no obvious etiology of right upper quadrant pain.  No significant pain, will have patient continue to monitor and allow GI to perform full evaluation.  Return precautions given.  Patient expresses understanding and agrees to plan.  Final Clinical Impressions(s) / UC Diagnoses   Final diagnoses:  Abdominal pain, right upper quadrant   ED Prescriptions    None     PDMP not reviewed this encounter.   Ok Edwards, PA-C 11/04/18 1637

## 2018-11-04 NOTE — Discharge Instructions (Addendum)
No alarming signs on exam. Urine with small blood, no infection. Continue to monitor symptoms. Keep hydrated, urine should be clear to pale yellow in color. Avoid heavy foods, follow up with GI as scheduled for reevaluation needed.

## 2018-11-10 ENCOUNTER — Ambulatory Visit: Payer: BC Managed Care – PPO | Admitting: Gastroenterology

## 2018-11-10 ENCOUNTER — Encounter: Payer: Self-pay | Admitting: Gastroenterology

## 2018-11-10 ENCOUNTER — Encounter

## 2018-11-10 ENCOUNTER — Other Ambulatory Visit: Payer: Self-pay

## 2018-11-10 VITALS — BP 108/80 | HR 72 | Temp 98.2°F | Ht 65.25 in | Wt 163.0 lb

## 2018-11-10 DIAGNOSIS — K219 Gastro-esophageal reflux disease without esophagitis: Secondary | ICD-10-CM

## 2018-11-10 DIAGNOSIS — R1012 Left upper quadrant pain: Secondary | ICD-10-CM | POA: Diagnosis not present

## 2018-11-10 MED ORDER — PANTOPRAZOLE SODIUM 40 MG PO TBEC: 40.0000 mg | DELAYED_RELEASE_TABLET | Freq: Two times a day (BID) | ORAL | 3 refills | Status: DC

## 2018-11-10 NOTE — Patient Instructions (Addendum)
Let's start with taking pantoprazole 40 twice daily taken 30 minutes prior to meals.  Avoid any dietary triggers Avoid spicy and acidic foods Limit your intake of coffee, tea, alcohol, and carbonated drinks Work to maintain a healthy weight Keep the head of the bed elevated with blocks if you are having any nighttime symptoms Stay upright for 2 hours after eating Avoid meals and snacks three to four hours before bedtime  Call or MyChart me if your symptoms aren't improving and we will proceed with an EGD (upper endoscopy) for further evaluation.  Let's check in 2-3 months from now to see how you are feeling.

## 2018-11-10 NOTE — Progress Notes (Signed)
Referring Provider: Thomasene Lot, DO Primary Care Physician:  Thomasene Lot, DO  Reason for Consultation: Acid reflux   IMPRESSION:  Reflux not responding to famotidine 20 mg BID since January 2020 Right sided lump in the neck/throat    - evaluated by Dr. Jearld Fenton    - no laryngoscopy performed Left sided abdominal pain, burning, and fullness x several weeks    - H pylori stool antigen negative 08/20/18  We will work to maximize medical therapy for reflux as this may explain all of her symptoms.  I am concerned about the right sided lump in her neck given the asymmetric symptomatology.  She was previously evaluated by ENT but laryngoscopy was not performed.  If her symptoms do not improve with pantoprazole twice daily and reflux lifestyle modifications we will proceed with EGD with esophageal and gastric biopsies given the differential of reflux esophagitis, persistent H pylori, PUD, gastritis, and non-erosive reflux disease.   PLAN: Reflux lifestyle modifications Pantoprazole 40 mg BID x 3 months EGD if not improving in 6-8 weeks Consider reassessment with ENT Follow-up in 2-3 months Abdominal imaging if EGD is nondiagnostic and symptoms persist  Please see the "Patient Instructions" section for addition details about the plan.  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 Sharee Holster for further evaluation of reflux. The history is obtained through the patient and review of her electronic medical record. She has had acute recurrent pansinusitis with postnasal drip and congestion, allergies, anxiety recently starting Prozac. She had ablation for SVT at age 17.   Reflux with a burning sensation in the chest that has become progressively more worrisome since January 2020. Persistent sensation of a right-sided lump in the neck/throat. Present between meals.  Left sided abdominal pain, burning, and fullness over  the last few weeks. Evaluated in ED. No imaging or labs were performed. She was told to follow-up with GI.  Associated right ear clogging Triggered by eating late Breakthrough symptoms despite twice daily famotidine since January Tried pantoprazole in February 40 mg daily x 3 months without improvement Doesn't want to be on long term medications Tried to make diet changes including avoiding coffee, acidic foods, alcohol Has been through a divorce this year and has had more alcohol dealing with this Weight may be up 10 pounds Recently had an IUD removed No NSAIDs  No evidence for GI bleeding, iron deficiency anemia, anorexia, unexplained weight loss, regurgitation, dysphonia, dysphagia, odynophagia, persistent vomiting, or gastrointestinal cancer in a first-degree relative.  She is concerned that she may have cancer.   She was seen by Dr. Suzanna Obey, with ENT, and his consultation notes reflux problems however I do not see any assessment for LPR or a laryngescope.  Stool antigen for H. pylori was - 08/20/2018 No prior abdominal imaging No prior endoscopic evaluation  No known family history of colon cancer or polyps. No family history of uterine/endometrial cancer, pancreatic cancer or gastric/stomach cancer.   Past Medical History:  Diagnosis Date  . Anxiety   . History of cardiac murmur   . History of chicken pox   . History of gastroesophageal reflux (GERD)   . History of hay fever   . History of thyroid disease   . Hx of dysplastic nevus 2016   multiple sites   . SVT (supraventricular tachycardia) (HCC)    as a teen  . Thyroid nodule    left  Past Surgical History:  Procedure Laterality Date  . CARDIAC ELECTROPHYSIOLOGY STUDY AND ABLATION    . TONSILLECTOMY AND ADENOIDECTOMY    . WISDOM TOOTH EXTRACTION      Current Outpatient Medications  Medication Sig Dispense Refill  . azelastine (ASTELIN) 0.1 % nasal spray Place 1 spray into both nostrils 2 (two) times  daily. 1 spray each nostril twice daily after sinus rinses 30 mL 11  . famotidine (PEPCID) 20 MG tablet Take 1 tablet (20 mg total) by mouth 2 (two) times daily. 180 tablet 0  . fluticasone (FLONASE) 50 MCG/ACT nasal spray 1 spray each nostril following sinus rinses twice daily 16 g 2  . levocetirizine (XYZAL) 5 MG tablet Take 1 tablet (5 mg total) by mouth every evening. 90 tablet 0  . EPINEPHrine (EPIPEN IJ) Inject as directed. As needed     No current facility-administered medications for this visit.     Allergies as of 11/10/2018 - Review Complete 11/10/2018  Allergen Reaction Noted  . Bee venom Anaphylaxis 11/30/2013    Family History  Problem Relation Age of Onset  . Heart attack Maternal Grandfather   . Arthritis Maternal Grandmother   . Dementia Maternal Grandmother   . Stroke Paternal Grandmother   . Diabetes Father   . Skin cancer Paternal Grandfather   . Graves' disease Mother     Social History   Socioeconomic History  . Marital status: Married    Spouse name: Not on file  . Number of children: 0  . Years of education: Not on file  . Highest education level: Not on file  Occupational History  . Occupation: Pharmacist, hospital  Social Needs  . Financial resource strain: Not on file  . Food insecurity    Worry: Not on file    Inability: Not on file  . Transportation needs    Medical: Not on file    Non-medical: Not on file  Tobacco Use  . Smoking status: Former Smoker    Packs/day: 0.25    Quit date: 2014    Years since quitting: 6.8  . Smokeless tobacco: Never Used  . Tobacco comment: socially only  Substance and Sexual Activity  . Alcohol use: Yes    Alcohol/week: 5.0 standard drinks    Types: 5 Standard drinks or equivalent per week    Comment: socially-weekends  . Drug use: No  . Sexual activity: Yes    Birth control/protection: None  Lifestyle  . Physical activity    Days per week: Not on file    Minutes per session: Not on file  . Stress: Not on file   Relationships  . Social Herbalist on phone: Not on file    Gets together: Not on file    Attends religious service: Not on file    Active member of club or organization: Not on file    Attends meetings of clubs or organizations: Not on file    Relationship status: Not on file  . Intimate partner violence    Fear of current or ex partner: Not on file    Emotionally abused: Not on file    Physically abused: Not on file    Forced sexual activity: Not on file  Other Topics Concern  . Not on file  Social History Narrative  . Not on file    Review of Systems: 12 system ROS is negative except as noted above except for allergies.   Physical Exam: General:   Alert,  well-nourished,  pleasant and cooperative in NAD Head:  Normocephalic and atraumatic. Eyes:  Sclera clear, no icterus.   Conjunctiva pink. Ears:  Normal auditory acuity. Nose:  No deformity, discharge,  or lesions. Mouth:  No deformity or lesions.   Neck:  Supple; no masses or thyromegaly. Lungs:  Clear throughout to auscultation.   No wheezes. Heart:  Regular rate and rhythm; no murmurs. Abdomen:  Soft, nontender but she localizes to the pain to the area just around the margin of the ribs from the flank to the xiphoid process, I am unable to reproduce her pain.  Nondistended, normal bowel sounds, no rebound or guarding. No hepatosplenomegaly.   Rectal:  Deferred  Msk:  Symmetrical. No boney deformities LAD: No inguinal or umbilical LAD Extremities:  No clubbing or edema. Neurologic:  Alert and  oriented x4;  grossly nonfocal Skin:  Intact without significant lesions or rashes. Psych:  Alert and cooperative. Normal mood and affect.     Aubriana Ravelo L. Orvan FalconerBeavers, MD, MPH 11/10/2018, 8:30 AM

## 2018-11-12 ENCOUNTER — Telehealth: Payer: Self-pay

## 2018-11-12 DIAGNOSIS — R590 Localized enlarged lymph nodes: Secondary | ICD-10-CM

## 2018-11-12 DIAGNOSIS — R221 Localized swelling, mass and lump, neck: Secondary | ICD-10-CM

## 2018-11-12 NOTE — Telephone Encounter (Signed)
-----   Message from Mellody Dance, DO sent at 11/11/2018  5:37 PM EDT ----- Yes I completely agree that a laryngoscopy would probably be appropriate at this time.  I will CC my staff and we will place another referral to a different ear nose and throat provider.  Thank you so much for your communication about this patient.  I appreciate it.  Please reach out at any time with any questions or concerns on any of our mutual patients.  My best, Deb ----- Message ----- From: Thornton Park, MD Sent: 11/10/2018   1:02 PM EDT To: Mellody Dance, DO  Thank you for your referral of Donna Contreras.  I am working her up for gastric etiologies to her symptoms.  However, I am concerned about her localized right neck and throat pain.  I saw Dr. Peggyann Juba notes from his consultation earlier this year.  However it looks like he thought this pain had resolved.  No laryngoscopy was performed at the time of his consultation.  As I am still relatively new I have no experience with Dr. Janace Hoard.  I wondered if you thought a second opinion from another ENT would be indicated.  Thanks for your input.  Thank you.  Thornton Park, MD, MPH [facility]

## 2018-11-13 ENCOUNTER — Encounter: Payer: Self-pay | Admitting: Family Medicine

## 2018-11-13 ENCOUNTER — Ambulatory Visit (INDEPENDENT_AMBULATORY_CARE_PROVIDER_SITE_OTHER): Payer: BC Managed Care – PPO | Admitting: Family Medicine

## 2018-11-13 ENCOUNTER — Other Ambulatory Visit: Payer: Self-pay

## 2018-11-13 VITALS — BP 121/83 | HR 54 | Temp 97.7°F | Resp 12 | Ht 65.0 in | Wt 161.5 lb

## 2018-11-13 DIAGNOSIS — H6983 Other specified disorders of Eustachian tube, bilateral: Secondary | ICD-10-CM

## 2018-11-13 DIAGNOSIS — Z719 Counseling, unspecified: Secondary | ICD-10-CM

## 2018-11-13 DIAGNOSIS — Z13220 Encounter for screening for lipoid disorders: Secondary | ICD-10-CM

## 2018-11-13 DIAGNOSIS — Z0000 Encounter for general adult medical examination without abnormal findings: Secondary | ICD-10-CM | POA: Diagnosis not present

## 2018-11-13 DIAGNOSIS — F4322 Adjustment disorder with anxiety: Secondary | ICD-10-CM

## 2018-11-13 DIAGNOSIS — R221 Localized swelling, mass and lump, neck: Secondary | ICD-10-CM

## 2018-11-13 DIAGNOSIS — Z8349 Family history of other endocrine, nutritional and metabolic diseases: Secondary | ICD-10-CM

## 2018-11-13 DIAGNOSIS — E559 Vitamin D deficiency, unspecified: Secondary | ICD-10-CM

## 2018-11-13 DIAGNOSIS — R12 Heartburn: Secondary | ICD-10-CM | POA: Diagnosis not present

## 2018-11-13 DIAGNOSIS — H6993 Unspecified Eustachian tube disorder, bilateral: Secondary | ICD-10-CM

## 2018-11-13 DIAGNOSIS — N029 Recurrent and persistent hematuria with unspecified morphologic changes: Secondary | ICD-10-CM | POA: Diagnosis not present

## 2018-11-13 DIAGNOSIS — J3089 Other allergic rhinitis: Secondary | ICD-10-CM | POA: Diagnosis not present

## 2018-11-13 DIAGNOSIS — Z130 Encounter for screening for diseases of the blood and blood-forming organs and certain disorders involving the immune mechanism: Secondary | ICD-10-CM

## 2018-11-13 LAB — POCT URINALYSIS DIPSTICK OB
Bilirubin, UA: NEGATIVE
Blood, UA: NEGATIVE
Glucose, UA: NEGATIVE
Ketones, UA: NEGATIVE
Leukocytes, UA: NEGATIVE
Nitrite, UA: NEGATIVE
POC,PROTEIN,UA: NEGATIVE
Spec Grav, UA: 1.02 (ref 1.010–1.025)
Urobilinogen, UA: 0.2 E.U./dL
pH, UA: 6.5 (ref 5.0–8.0)

## 2018-11-13 NOTE — Patient Instructions (Signed)
Preventive Care for Adults, Female  A healthy lifestyle and preventive care can promote health and wellness. Preventive health guidelines for women include the following key practices.   A routine yearly physical is a good way to check with your health care provider about your health and preventive screening. It is a chance to share any concerns and updates on your health and to receive a thorough exam.   Visit your dentist for a routine exam and preventive care every 6 months. Brush your teeth twice a day and floss once a day. Good oral hygiene prevents tooth decay and gum disease.   The frequency of eye exams is based on your age, health, family medical history, use of contact lenses, and other factors. Follow your health care provider's recommendations for frequency of eye exams.   Eat a healthy diet. Foods like vegetables, fruits, whole grains, low-fat dairy products, and lean protein foods contain the nutrients you need without too many calories. Decrease your intake of foods high in solid fats, added sugars, and salt. Eat the right amount of calories for you.Get information about a proper diet from your health care provider, if necessary.   Regular physical exercise is one of the most important things you can do for your health. Most adults should get at least 150 minutes of moderate-intensity exercise (any activity that increases your heart rate and causes you to sweat) each week. In addition, most adults need muscle-strengthening exercises on 2 or more days a week.   Maintain a healthy weight. The body mass index (BMI) is a screening tool to identify possible weight problems. It provides an estimate of body fat based on height and weight. Your health care provider can find your BMI, and can help you achieve or maintain a healthy weight.For adults 20 years and older:   - A BMI below 18.5 is considered underweight.   - A BMI of 18.5 to 24.9 is normal.   - A BMI of 25 to 29.9 is  considered overweight.   - A BMI of 30 and above is considered obese.   Maintain normal blood lipids and cholesterol levels by exercising and minimizing your intake of trans and saturated fats.  Eat a balanced diet with plenty of fruit and vegetables. Blood tests for lipids and cholesterol should begin at age 65 and be repeated every 5 years minimum.  If your lipid or cholesterol levels are high, you are over 40, or you are at high risk for heart disease, you may need your cholesterol levels checked more frequently.Ongoing high lipid and cholesterol levels should be treated with medicines if diet and exercise are not working.   If you smoke, find out from your health care provider how to quit. If you do not use tobacco, do not start.   Lung cancer screening is recommended for adults aged 10-80 years who are at high risk for developing lung cancer because of a history of smoking. A yearly low-dose CT scan of the lungs is recommended for people who have at least a 30-pack-year history of smoking and are a current smoker or have quit within the past 15 years. A pack year of smoking is smoking an average of 1 pack of cigarettes a day for 1 year (for example: 1 pack a day for 30 years or 2 packs a day for 15 years). Yearly screening should continue until the smoker has stopped smoking for at least 15 years. Yearly screening should be stopped for people who develop a  health problem that would prevent them from having lung cancer treatment.   If you are pregnant, do not drink alcohol. If you are breastfeeding, be very cautious about drinking alcohol. If you are not pregnant and choose to drink alcohol, do not have more than 1 drink per day. One drink is considered to be 12 ounces (355 mL) of beer, 5 ounces (148 mL) of wine, or 1.5 ounces (44 mL) of liquor.   Avoid use of street drugs. Do not share needles with anyone. Ask for help if you need support or instructions about stopping the use of  drugs.   High blood pressure causes heart disease and increases the risk of stroke. Your blood pressure should be checked at least yearly.  Ongoing high blood pressure should be treated with medicines if weight loss and exercise do not work.   If you are 25-14 years old, ask your health care provider if you should take aspirin to prevent strokes.   Diabetes screening involves taking a blood sample to check your fasting blood sugar level. This should be done once every 3 years, after age 48, if you are within normal weight and without risk factors for diabetes. Testing should be considered at a younger age or be carried out more frequently if you are overweight and have at least 1 risk factor for diabetes.   Breast cancer screening is essential preventive care for women. You should practice "breast self-awareness."  This means understanding the normal appearance and feel of your breasts and may include breast self-examination.  Any changes detected, no matter how small, should be reported to a health care provider.  Women in their 89s and 30s should have a clinical breast exam (CBE) by a health care provider as part of a regular health exam every 1 to 3 years.  After age 63, women should have a CBE every year.  Starting at age 81, women should consider having a mammogram (breast X-ray test) every year.  Women who have a family history of breast cancer should talk to their health care provider about genetic screening.  Women at a high risk of breast cancer should talk to their health care providers about having an MRI and a mammogram every year.   -Breast cancer gene (BRCA)-related cancer risk assessment is recommended for women who have family members with BRCA-related cancers. BRCA-related cancers include breast, ovarian, tubal, and peritoneal cancers. Having family members with these cancers may be associated with an increased risk for harmful changes (mutations) in the breast cancer genes BRCA1 and  BRCA2. Results of the assessment will determine the need for genetic counseling and BRCA1 and BRCA2 testing.   The Pap test is a screening test for cervical cancer. A Pap test can show cell changes on the cervix that might become cervical cancer if left untreated. A Pap test is a procedure in which cells are obtained and examined from the lower end of the uterus (cervix).   - Women should have a Pap test starting at age 13.   - Between ages 51 and 22, Pap tests should be repeated every 2 years.   - Beginning at age 70, you should have a Pap test every 3 years as long as the past 3 Pap tests have been normal.   - Some women have medical problems that increase the chance of getting cervical cancer. Talk to your health care provider about these problems. It is especially important to talk to your health care provider if a  new problem develops soon after your last Pap test. In these cases, your health care provider may recommend more frequent screening and Pap tests.   - The above recommendations are the same for women who have or have not gotten the vaccine for human papillomavirus (HPV).   - If you had a hysterectomy for a problem that was not cancer or a condition that could lead to cancer, then you no longer need Pap tests. Even if you no longer need a Pap test, a regular exam is a good idea to make sure no other problems are starting.   - If you are between ages 8 and 94 years, and you have had normal Pap tests going back 10 years, you no longer need Pap tests. Even if you no longer need a Pap test, a regular exam is a good idea to make sure no other problems are starting.   - If you have had past treatment for cervical cancer or a condition that could lead to cancer, you need Pap tests and screening for cancer for at least 20 years after your treatment.   - If Pap tests have been discontinued, risk factors (such as a new sexual partner) need to be reassessed to determine if screening should  be resumed.   - The HPV test is an additional test that may be used for cervical cancer screening. The HPV test looks for the virus that can cause the cell changes on the cervix. The cells collected during the Pap test can be tested for HPV. The HPV test could be used to screen women aged 9 years and older, and should be used in women of any age who have unclear Pap test results. After the age of 48, women should have HPV testing at the same frequency as a Pap test.   Colorectal cancer can be detected and often prevented. Most routine colorectal cancer screening begins at the age of 94 years and continues through age 56 years. However, your health care provider may recommend screening at an earlier age if you have risk factors for colon cancer. On a yearly basis, your health care provider may provide home test kits to check for hidden blood in the stool.  Use of a small camera at the end of a tube, to directly examine the colon (sigmoidoscopy or colonoscopy), can detect the earliest forms of colorectal cancer. Talk to your health care provider about this at age 47, when routine screening begins. Direct exam of the colon should be repeated every 5 -10 years through age 65 years, unless early forms of pre-cancerous polyps or small growths are found.   People who are at an increased risk for hepatitis B should be screened for this virus. You are considered at high risk for hepatitis B if:  -You were born in a country where hepatitis B occurs often. Talk with your health care provider about which countries are considered high risk.  - Your parents were born in a high-risk country and you have not received a shot to protect against hepatitis B (hepatitis B vaccine).  - You have HIV or AIDS.  - You use needles to inject street drugs.  - You live with, or have sex with, someone who has Hepatitis B.  - You get hemodialysis treatment.  - You take certain medicines for conditions like cancer, organ  transplantation, and autoimmune conditions.   Hepatitis C blood testing is recommended for all people born from [birth year] through 1965 and any individual  with known risks for hepatitis C.   Practice safe sex. Use condoms and avoid high-risk sexual practices to reduce the spread of sexually transmitted infections (STIs). STIs include gonorrhea, chlamydia, syphilis, trichomonas, herpes, HPV, and human immunodeficiency virus (HIV). Herpes, HIV, and HPV are viral illnesses that have no cure. They can result in disability, cancer, and death. Sexually active women aged 17 years and younger should be checked for chlamydia. Older women with new or multiple partners should also be tested for chlamydia. Testing for other STIs is recommended if you are sexually active and at increased risk.   Osteoporosis is a disease in which the bones lose minerals and strength with aging. This can result in serious bone fractures or breaks. The risk of osteoporosis can be identified using a bone density scan. Women ages 38 years and over and women at risk for fractures or osteoporosis should discuss screening with their health care providers. Ask your health care provider whether you should take a calcium supplement or vitamin D to There are also several preventive steps women can take to avoid osteoporosis and resulting fractures or to keep osteoporosis from worsening. -->Recommendations include:  Eat a balanced diet high in fruits, vegetables, calcium, and vitamins.  Get enough calcium. The recommended total intake of is 1,200 mg daily; for best absorption, if taking supplements, divide doses into 250-500 mg doses throughout the day. Of the two types of calcium, calcium carbonate is best absorbed when taken with food but calcium citrate can be taken on an empty stomach.  Get enough vitamin D. N[employer] and the [place] recommend at least 1,000 IU per day for women age 28 and over who are at risk of vitamin D  deficiency. Vitamin D deficiency can be caused by inadequate sun exposure (for example, those who live in [place]).  Avoid alcohol and smoking. Heavy alcohol intake (more than 7 drinks per week) increases the risk of falls and hip fracture and women smokers tend to lose bone more rapidly and have lower bone mass than nonsmokers. Stopping smoking is one of the most important changes women can make to improve their health and decrease risk for disease.  Be physically active every day. Weight-bearing exercise (for example, fast walking, hiking, jogging, and weight training) may strengthen bones or slow the rate of bone loss that comes with aging. Balancing and muscle-strengthening exercises can reduce the risk of falling and fracture.  Consider therapeutic medications. Currently, several types of effective drugs are available. Healthcare providers can recommend the type most appropriate for each woman.  Eliminate environmental factors that may contribute to accidents. Falls cause nearly 90% of all osteoporotic fractures, so reducing this risk is an important bone-health strategy. Measures include ample lighting, removing obstructions to walking, using nonskid rugs on floors, and placing mats and/or grab bars in showers.  Be aware of medication side effects. Some common medicines make bones weaker. These include a type of steroid drug called glucocorticoids used for arthritis and asthma, some antiseizure drugs, certain sleeping pills, treatments for endometriosis, and some cancer drugs. An overactive thyroid gland or using too much thyroid hormone for an underactive thyroid can also be a problem. If you are taking these medicines, talk to your doctor about what you can do to help protect your bones.reduce the rate of osteoporosis.    Menopause can be associated with physical symptoms and risks. Hormone replacement therapy is available to decrease symptoms and risks. You should talk to your  health care provider  about whether hormone replacement therapy is right for you.   Use sunscreen. Apply sunscreen liberally and repeatedly throughout the day. You should seek shade when your shadow is shorter than you. Protect yourself by wearing long sleeves, pants, a wide-brimmed hat, and sunglasses year round, whenever you are outdoors.   Once a month, do a whole body skin exam, using a mirror to look at the skin on your back. Tell your health care provider of new moles, moles that have irregular borders, moles that are larger than a pencil eraser, or moles that have changed in shape or color.   -Stay current with required vaccines (immunizations).   Influenza vaccine. All adults should be immunized every year.  Tetanus, diphtheria, and acellular pertussis (Td, Tdap) vaccine. Pregnant women should receive 1 dose of Tdap vaccine during each pregnancy. The dose should be obtained regardless of the length of time since the last dose. Immunization is preferred during the 27th 36th week of gestation. An adult who has not previously received Tdap or who does not know her vaccine status should receive 1 dose of Tdap. This initial dose should be followed by tetanus and diphtheria toxoids (Td) booster doses every 10 years. Adults with an unknown or incomplete history of completing a 3-dose immunization series with Td-containing vaccines should begin or complete a primary immunization series including a Tdap dose. Adults should receive a Td booster every 10 years.  Varicella vaccine. An adult without evidence of immunity to varicella should receive 2 doses or a second dose if she has previously received 1 dose. Pregnant females who do not have evidence of immunity should receive the first dose after pregnancy. This first dose should be obtained before leaving the health care facility. The second dose should be obtained 4 8 weeks after the first dose.  Human papillomavirus (HPV) vaccine. Females aged 13 26  years who have not received the vaccine previously should obtain the 3-dose series. The vaccine is not recommended for use in pregnant females. However, pregnancy testing is not needed before receiving a dose. If a female is found to be pregnant after receiving a dose, no treatment is needed. In that case, the remaining doses should be delayed until after the pregnancy. Immunization is recommended for any person with an immunocompromised condition through the age of 26 years if she did not get any or all doses earlier. During the 3-dose series, the second dose should be obtained 4 8 weeks after the first dose. The third dose should be obtained 24 weeks after the first dose and 16 weeks after the second dose.  Zoster vaccine. One dose is recommended for adults aged 60 years or older unless certain conditions are present.  Measles, mumps, and rubella (MMR) vaccine. Adults born before 1957 generally are considered immune to measles and mumps. Adults born in 1957 or later should have 1 or more doses of MMR vaccine unless there is a contraindication to the vaccine or there is laboratory evidence of immunity to each of the three diseases. A routine second dose of MMR vaccine should be obtained at least 28 days after the first dose for students attending postsecondary schools, health care workers, or international travelers. People who received inactivated measles vaccine or an unknown type of measles vaccine during 1963 1967 should receive 2 doses of MMR vaccine. People who received inactivated mumps vaccine or an unknown type of mumps vaccine before 1979 and are at high risk for mumps infection should consider immunization with 2 doses of   MMR vaccine. For females of childbearing age, rubella immunity should be determined. If there is no evidence of immunity, females who are not pregnant should be vaccinated. If there is no evidence of immunity, females who are pregnant should delay immunization until after pregnancy.  Unvaccinated health care workers born before [birth year] who lack laboratory evidence of measles, mumps, or rubella immunity or laboratory confirmation of disease should consider measles and mumps immunization with 2 doses of MMR vaccine or rubella immunization with 1 dose of MMR vaccine.  Pneumococcal 13-valent conjugate (PCV13) vaccine. When indicated, a person who is uncertain of her immunization history and has no record of immunization should receive the PCV13 vaccine. An adult aged 64 years or older who has certain medical conditions and has not been previously immunized should receive 1 dose of PCV13 vaccine. This PCV13 should be followed with a dose of pneumococcal polysaccharide (PPSV23) vaccine. The PPSV23 vaccine dose should be obtained at least 8 weeks after the dose of PCV13 vaccine. An adult aged 56 years or older who has certain medical conditions and previously received 1 or more doses of PPSV23 vaccine should receive 1 dose of PCV13. The PCV13 vaccine dose should be obtained 1 or more years after the last PPSV23 vaccine dose.  Pneumococcal polysaccharide (PPSV23) vaccine. When PCV13 is also indicated, PCV13 should be obtained first. All adults aged 67 years and older should be immunized. An adult younger than age 86 years who has certain medical conditions should be immunized. Any person who resides in a nursing home or long-term care facility should be immunized. An adult smoker should be immunized. People with an immunocompromised condition and certain other conditions should receive both PCV13 and PPSV23 vaccines. People with human immunodeficiency virus (HIV) infection should be immunized as soon as possible after diagnosis. Immunization during chemotherapy or radiation therapy should be avoided. Routine use of PPSV23 vaccine is not recommended for American Indians, [place] Natives, or people younger than 65 years unless there are medical conditions that require PPSV23 vaccine. When indicated,  people who have unknown immunization and have no record of immunization should receive PPSV23 vaccine. One-time revaccination 5 years after the first dose of PPSV23 is recommended for people aged 79 64 years who have chronic kidney failure, nephrotic syndrome, asplenia, or immunocompromised conditions. People who received 1 2 doses of PPSV23 before age 82 years should receive another dose of PPSV23 vaccine at age 46 years or later if at least 5 years have passed since the previous dose. Doses of PPSV23 are not needed for people immunized with PPSV23 at or after age 58 years.  Meningococcal vaccine. Adults with asplenia or persistent complement component deficiencies should receive 2 doses of quadrivalent meningococcal conjugate (MenACWY-D) vaccine. The doses should be obtained at least 2 months apart. Microbiologists working with certain meningococcal bacteria, [place] recruits, people at risk during an outbreak, and people who travel to or live in countries with a high rate of meningitis should be immunized. A first-year college student up through age 69 years who is living in a residence hall should receive a dose if she did not receive a dose on or after her 16th birthday. Adults who have certain high-risk conditions should receive one or more doses of vaccine.  Hepatitis A vaccine. Adults who wish to be protected from this disease, have certain high-risk conditions, work with hepatitis A-infected animals, work in hepatitis A research labs, or travel to or work in countries with a high rate of hepatitis A should be  immunized. Adults who were previously unvaccinated and who anticipate close contact with an international adoptee during the first 60 days after arrival in the [place] from a country with a high rate of hepatitis A should be immunized.  Hepatitis B vaccine.  Adults who wish to be protected from this disease, have certain high-risk conditions, may be exposed to blood or other infectious  body fluids, are household contacts or sex partners of hepatitis B positive people, are clients or workers in certain care facilities, or travel to or work in countries with a high rate of hepatitis B should be immunized.  Haemophilus influenzae type b (Hib) vaccine. A previously unvaccinated person with asplenia or sickle cell disease or having a scheduled splenectomy should receive 1 dose of Hib vaccine. Regardless of previous immunization, a recipient of a hematopoietic stem cell transplant should receive a 3-dose series 6 12 months after her successful transplant. Hib vaccine is not recommended for adults with HIV infection.  Preventive Services / Frequency Ages 19 to 39years  Blood pressure check.** / Every 1 to 2 years.  Lipid and cholesterol check.** / Every 5 years beginning at age 20.  Clinical breast exam.** / Every 3 years for women in their 20s and 30s.  BRCA-related cancer risk assessment.** / For women who have family members with a BRCA-related cancer (breast, ovarian, tubal, or peritoneal cancers).  Pap test.** / Every 2 years from ages 21 through 29. Every 3 years starting at age 30 through age 65 or 70 with a history of 3 consecutive normal Pap tests.  HPV screening.** / Every 3 years from ages 30 through ages 65 to 70 with a history of 3 consecutive normal Pap tests.  Hepatitis C blood test.** / For any individual with known risks for hepatitis C.  Skin self-exam. / Monthly.  Influenza vaccine. / Every year.  Tetanus, diphtheria, and acellular pertussis (Tdap, Td) vaccine.** / Consult your health care provider. Pregnant women should receive 1 dose of Tdap vaccine during each pregnancy. 1 dose of Td every 10 years.  Varicella vaccine.** / Consult your health care provider. Pregnant females who do not have evidence of immunity should receive the first dose after pregnancy.  HPV vaccine. / 3 doses over 6 months, if 26 and younger. The vaccine is not recommended for use in  pregnant females. However, pregnancy testing is not needed before receiving a dose.  Measles, mumps, rubella (MMR) vaccine.** / You need at least 1 dose of MMR if you were born in 1957 or later. You may also need a 2nd dose. For females of childbearing age, rubella immunity should be determined. If there is no evidence of immunity, females who are not pregnant should be vaccinated. If there is no evidence of immunity, females who are pregnant should delay immunization until after pregnancy.  Pneumococcal 13-valent conjugate (PCV13) vaccine.** / Consult your health care provider.  Pneumococcal polysaccharide (PPSV23) vaccine.** / 1 to 2 doses if you smoke cigarettes or if you have certain conditions.  Meningococcal vaccine.** / 1 dose if you are age 19 to 21 years and a first-year college student living in a residence hall, or have one of several medical conditions, you need to get vaccinated against meningococcal disease. You may also need additional booster doses.  Hepatitis A vaccine.** / Consult your health care provider.  Hepatitis B vaccine.** / Consult your health care provider.  Haemophilus influenzae type b (Hib) vaccine.** / Consult your health care provider.  Ages 40 to 64years    Blood pressure check.** / Every 1 to 2 years.  Lipid and cholesterol check.** / Every 5 years beginning at age 22 years.  Lung cancer screening. / Every year if you are aged 81 80 years and have a 30-pack-year history of smoking and currently smoke or have quit within the past 15 years. Yearly screening is stopped once you have quit smoking for at least 15 years or develop a health problem that would prevent you from having lung cancer treatment.  Clinical breast exam.** / Every year after age 100 years.  BRCA-related cancer risk assessment.** / For women who have family members with a BRCA-related cancer (breast, ovarian, tubal, or peritoneal cancers).  Mammogram.** / Every year beginning at age 74  years and continuing for as long as you are in good health. Consult with your health care provider.  Pap test.** / Every 3 years starting at age 28 years through age 30 or 38 years with a history of 3 consecutive normal Pap tests.  HPV screening.** / Every 3 years from ages 85 years through ages 91 to 58 years with a history of 3 consecutive normal Pap tests.  Fecal occult blood test (FOBT) of stool. / Every year beginning at age 60 years and continuing until age 55 years. You may not need to do this test if you get a colonoscopy every 10 years.  Flexible sigmoidoscopy or colonoscopy.** / Every 5 years for a flexible sigmoidoscopy or every 10 years for a colonoscopy beginning at age 3 years and continuing until age 56 years.  Hepatitis C blood test.** / For all people born from [birth year] through 1965 and any individual with known risks for hepatitis C.  Skin self-exam. / Monthly.  Influenza vaccine. / Every year.  Tetanus, diphtheria, and acellular pertussis (Tdap/Td) vaccine.** / Consult your health care provider. Pregnant women should receive 1 dose of Tdap vaccine during each pregnancy. 1 dose of Td every 10 years.  Varicella vaccine.** / Consult your health care provider. Pregnant females who do not have evidence of immunity should receive the first dose after pregnancy.  Zoster vaccine.** / 1 dose for adults aged 50 years or older.  Measles, mumps, rubella (MMR) vaccine.** / You need at least 1 dose of MMR if you were born in 1957 or later. You may also need a 2nd dose. For females of childbearing age, rubella immunity should be determined. If there is no evidence of immunity, females who are not pregnant should be vaccinated. If there is no evidence of immunity, females who are pregnant should delay immunization until after pregnancy.  Pneumococcal 13-valent conjugate (PCV13) vaccine.** / Consult your health care provider.  Pneumococcal polysaccharide (PPSV23) vaccine.** / 1 to 2 doses if  you smoke cigarettes or if you have certain conditions.  Meningococcal vaccine.** / Consult your health care provider.  Hepatitis A vaccine.** / Consult your health care provider.  Hepatitis B vaccine.** / Consult your health care provider.  Haemophilus influenzae type b (Hib) vaccine.** / Consult your health care provider.  Ages 47 years and over  Blood pressure check.** / Every 1 to 2 years.  Lipid and cholesterol check.** / Every 5 years beginning at age 55 years.  Lung cancer screening. / Every year if you are aged 57 80 years and have a 30-pack-year history of smoking and currently smoke or have quit within the past 15 years. Yearly screening is stopped once you have quit smoking for at least 15 years or develop a health problem that  would prevent you from having lung cancer treatment.  Clinical breast exam.** / Every year after age 68 years.  BRCA-related cancer risk assessment.** / For women who have family members with a BRCA-related cancer (breast, ovarian, tubal, or peritoneal cancers).  Mammogram.** / Every year beginning at age 47 years and continuing for as long as you are in good health. Consult with your health care provider.  Pap test.** / Every 3 years starting at age 59 years through age 26 or 26 years with 3 consecutive normal Pap tests. Testing can be stopped between 65 and 70 years with 3 consecutive normal Pap tests and no abnormal Pap or HPV tests in the past 10 years.  HPV screening.** / Every 3 years from ages 71 years through ages 19 or 96 years with a history of 3 consecutive normal Pap tests. Testing can be stopped between 65 and 70 years with 3 consecutive normal Pap tests and no abnormal Pap or HPV tests in the past 10 years.  Fecal occult blood test (FOBT) of stool. / Every year beginning at age 59 years and continuing until age 7 years. You may not need to do this test if you get a colonoscopy every 10 years.  Flexible sigmoidoscopy or colonoscopy.** /  Every 5 years for a flexible sigmoidoscopy or every 10 years for a colonoscopy beginning at age 28 years and continuing until age 27 years.  Hepatitis C blood test.** / For all people born from [birth year] through 1965 and any individual with known risks for hepatitis C.  Osteoporosis screening.** / A one-time screening for women ages 6 years and over and women at risk for fractures or osteoporosis.  Skin self-exam. / Monthly.  Influenza vaccine. / Every year.  Tetanus, diphtheria, and acellular pertussis (Tdap/Td) vaccine.** / 1 dose of Td every 10 years.  Varicella vaccine.** / Consult your health care provider.  Zoster vaccine.** / 1 dose for adults aged 20 years or older.  Pneumococcal 13-valent conjugate (PCV13) vaccine.** / Consult your health care provider.  Pneumococcal polysaccharide (PPSV23) vaccine.** / 1 dose for all adults aged 95 years and older.  Meningococcal vaccine.** / Consult your health care provider.  Hepatitis A vaccine.** / Consult your health care provider.  Hepatitis B vaccine.** / Consult your health care provider.  Haemophilus influenzae type b (Hib) vaccine.** / Consult your health care provider. ** Family history and personal history of risk and conditions may change your health care provider's recommendations. Document Released: 03/06/2001 Document Revised: 10/29/2012  [facility] Patient Information 2014 [place], [place].   EXERCISE AND DIET:  We recommended that you start or continue a regular exercise program for good health. Regular exercise means any activity that makes your heart beat faster and makes you sweat.  We recommend exercising at least 30 minutes per day at least 3 days a week, preferably 5.  We also recommend a diet low in fat and sugar / carbohydrates.  Inactivity, poor dietary choices and obesity can cause diabetes, heart attack, stroke, and kidney damage, among others.     ALCOHOL AND SMOKING:  Women should limit their alcohol intake to no  more than 7 drinks/beers/glasses of wine (combined, not each!) per week. Moderation of alcohol intake to this level decreases your risk of breast cancer and liver damage.  ( And of course, no recreational drugs are part of a healthy lifestyle.)  Also, you should not be smoking at all or even being exposed to second hand smoke. Most people know smoking can  cause cancer, and various heart and lung diseases, but did you know it also contributes to weakening of your bones?  Aging of your skin?  Yellowing of your teeth and nails?   CALCIUM AND VITAMIN D:  Adequate intake of calcium and Vitamin D are recommended.  The recommendations for exact amounts of these supplements seem to change often, but generally speaking 600 mg of calcium (either carbonate or citrate) and 800 units of Vitamin D per day seems prudent. Certain women may benefit from higher intake of Vitamin D.  If you are among these women, your doctor will have told you during your visit.     PAP SMEARS:  Pap smears, to check for cervical cancer or precancers,  have traditionally been done yearly, although recent scientific advances have shown that most women can have pap smears less often.  However, every woman still should have a physical exam from her gynecologist or primary care physician every year. It will include a breast check, inspection of the vulva and vagina to check for abnormal growths or skin changes, a visual exam of the cervix, and then an exam to evaluate the size and shape of the uterus and ovaries.  And after 38 years of age, a rectal exam is indicated to check for rectal cancers. We will also provide age appropriate advice regarding health maintenance, like when you should have certain vaccines, screening for sexually transmitted diseases, bone density testing, colonoscopy, mammograms, etc.    MAMMOGRAMS:  All women over 40 years old should have a yearly mammogram. Many facilities now offer a "3D" mammogram, which may cost  around $50 extra out of pocket. If possible,  we recommend you accept the option to have the 3D mammogram performed.  It both reduces the number of women who will be called back for extra views which then turn out to be normal, and it is better than the routine mammogram at detecting truly abnormal areas.     COLONOSCOPY:  Colonoscopy to screen for colon cancer is recommended for all women at age 27.  We know, you hate the idea of the prep.  We agree, BUT, having colon cancer and not knowing it is worse!!  Colon cancer so often starts as a polyp that can be seen and removed at colonscopy, which can quite literally save your life!  And if your first colonoscopy is normal and you have no family history of colon cancer, most women don't have to have it again for 10 years.  Once every ten years, you can do something that may end up saving your life, right?  We will be happy to help you get it scheduled when you are ready.  Be sure to check your insurance coverage so you understand how much it will cost.  It may be covered as a preventative service at no cost, but you should check your particular policy.

## 2018-11-13 NOTE — Progress Notes (Signed)
Impression and Recommendations:    1. Encounter for wellness examination   2. h/o Vitamin D deficiency   3. Family history of Graves' disease   4. Screening for deficiency anemia   5. Screening for lipid disorders   6. Health education/counseling   7. Adjustment reaction with anxiety   8. Environmental and seasonal allergies   9. sensation of R sided Lump in neck/throat   10. Heartburn   11. ETD (Eustachian tube dysfunction), bilateral   12. Benign hematuria     1) Anticipatory Guidance: Discussed importance of wearing a seatbelt while driving, not texting while driving; sunscreen when outside along with yearly skin surveillance; eating a well balanced and modest diet; physical activity at least 25 minutes per day or 150 min/ week of moderate to intense activity.  Hematuria found at an urgent care visit in the recent past  -We will recheck UA today to look for hematuria/resolution of hematuria  Adjustment reaction with anxiety  - Encouraged patient to have her female health monitored by OBGYN if she desires to get pregnant. - Encouraged patient to ask OBGYN about methods to manage anxiety during pregnancy.  Allergies, ETD (Eustachian tube dysfunction), bilateral - Patient reports improvement with nasal sprays as we prescribed. - Continue treatment plan as prescribed.  Continue with NeilMed sinus rinses twice daily  Heartburn - followed by Gastroenterology  -  Told patient if reflux symptoms are well controlled, she may decrease her use of pantoprazole to once daily, and cont famotidine BID - Discussed that Gastroenterology will continue to manage patient's need for further imaging and adjust treatment plan PRN if Reflux sx worsen/ don't improve.   2) Immunizations / Screenings / Labs:   All immunizations and screenings that patient agrees to, are up-to-date per recommendations or will be updated today.  Patient understands the needs for q 45mo dental and yearly vision  screens which pt will schedule independently. Obtain CBC, CMP, HgA1c, Lipid panel, TSH and vit D when fasting if not already done recently.   - Last pap smear UTD, normal, HPV negative. - Discussed guidelines and recommendations with patient today.  - Reviewed the meaning of high risk sexual activity with patient today. - Extensive education provided and all questions answered. - Prudent sexual health habits and screening discussed with patient today.  - Advised patient to continue following up with Dr. Dion Contreras of OBGYN.  - Discussed beginning mammogram screening at age 38.  3) Weight:   Discussed goal of losing even 5-10% of current body weight which would improve overall feelings of well being and improve objective health data significantly.   Improve nutrient density of diet through increasing intake of fruits and vegetables and decreasing saturated/trans fats, white flour products and refined sugar products.   4) Lifestyle & Preventative Health Maintenance - Advised patient to continue working toward exercising to improve overall mental, physical, and emotional health.    - Reviewed the "spokes of the wheel" of mood and health management.  Stressed the importance of ongoing prudent habits, including regular exercise, appropriate sleep hygiene, healthful dietary habits, and prayer/meditation to relax.  - Encouraged patient to engage in daily physical activity, especially a formal exercise routine.  Recommended that the patient eventually strive for at least 150 minutes of moderate cardiovascular activity per week according to guidelines established by the [facility].   - Healthy dietary habits encouraged, including low-carb, and high amounts of lean protein in diet.   - Patient should also consume adequate  amounts of water.  - Health counseling performed.  All questions answered.  - Novel Covid -19 counseling done; all questions were answered.   - Current CDC / federal and [place]  guidelines reviewed with patient  - Reminded pt of extreme importance of social distancing; wearing a mask when out in public; insensate handwashing and cleaning of surfaces, avoiding unnecessary trips for shopping and avoiding ALL but emergency appts etc. - Told patient to be prepared, not scared; and be smart for the sake of others - Patient will call with any additional concerns  Recommendations - Encouraged patient to have follow-up to address additional concerns in perianal region if desired. - Patient knows to make follow up for further concerns PRN.   No orders of the defined types were placed in this encounter.   Orders Placed This Encounter  Procedures  . CBC with Differential/Platelet  . Comprehensive metabolic panel  . Hemoglobin A1c  . Lipid panel  . T3  . T4, free  . TSH  . VITAMIN D 25 Hydroxy (Vit-D Deficiency, Fractures)  . POC Urinalysis Dipstick OB     Return for Follow-up near future to address peri-anal lesion if needed otherwise 3-4 mo.   Gross side effects, risk and benefits, and alternatives of medications discussed with patient.  Patient is aware that all medications have potential side effects and we are unable to predict every side effect or drug-drug interaction that may occur.  Expresses verbal understanding and consents to current therapy plan and treatment regimen.  F-up preventative CPE in 1 year- reminded pt again, this is in addition to any chronic care visits.    Please see orders placed and AVS handed out to patient at the end of our visit for further patient instructions/ counseling done pertaining to today's office visit.  This document serves as a record of services personally performed by Thomasene Lot, DO. It was created on her behalf by Peggye Fothergill, a trained medical scribe. The creation of this record is based on the scribe's personal observations and the provider's statements to them.   I have reviewed the above medical  documentation for accuracy and completeness and I concur.  Thomasene Lot, DO 11/13/2018 3:43 PM        Subjective:    Chief Complaint  Patient presents with  . Annual Exam   CC:   HPI: Donna Contreras is a 38 y.o. female who presents to [facility] today a yearly health maintenance exam.  Health Maintenance Summary Reviewed and updated, unless pt declines services.  Colonoscopy:  Denies first-degree family history of colon cancer. Tobacco History Reviewed:  Former smoker, quit in 2014.  Continues to avoid smoking. Alcohol:  No concerns, no excessive use. Exercise Habits:  Not meeting AHA guidelines. STD concerns:  Notes has had a new sexual partner since her last pap smear.  Confirms she has not been using condoms every time she has sex. Drug Use:  None. Birth control method:  Was formerly using an IUD, but had it taken out.  Notes follows up with Dr. Dion Body of [facility].  Patient states she does wish to get pregnant. Menses regular:  Followed by OBGYN. Lumps or breast concerns:  No. Breast Cancer Family History:  No; has never had a mammogram. Denies first-degree family history of breast cancer.  Notes her new intimate partner is a Proofreader. She says she is happier now and thinks it may be due to her new relationship.  -  Eye Health Denies concerns with her vision.   States she recently had an eye exam.  - Dental Health Notes goes every six months for follow-up.  - Female Health Follows up with Dr. Dion Body of [place].  Notes she had a trace of blood in her urine and needs a re check as advised by Urgent Care.  - Dermatological Health Notes she goes to dermatology yearly.  - ENT Health (post-nasal drip) Says her Astelin spray is working well. Notes continues using both nasal sprays and "it works." "That's what made my throat feel better."  - GI Health Denies GI problems, changes, or concerns recently. Notes she felt  reassured by Dr. Orvan Falconer of Gastroenterology. She had a very good experience with this specialty follow-up recently.  Patient is currently taking Protonix as directed by GI. Patient says "I'm trying to wean off all of it because I don't want to be on it."  Notes she does have a "pimple" on her rear end where she once had a cyst "years ago."     Immunization History  Administered Date(s) Administered  . Influenza,inj,Quad PF,6+ Mos 10/16/2016, 10/16/2018  . Influenza,trivalent, recombinat, inj, PF 11/24/2014  . Influenza-Unspecified 10/16/2016  . Tdap 10/16/2016    Health Maintenance  Topic Date Due  . PAP SMEAR-Modifier  12/25/2020  . TETANUS/TDAP  10/17/2026  . INFLUENZA VACCINE  Completed  . HIV Screening  Completed     Wt Readings from Last 3 Encounters:  11/13/18 161 lb 8 oz (73.3 kg)  11/10/18 163 lb (73.9 kg)  10/16/18 160 lb 12.8 oz (72.9 kg)   BP Readings from Last 3 Encounters:  11/13/18 121/83  11/10/18 108/80  11/04/18 133/86   Pulse Readings from Last 3 Encounters:  11/13/18 (!) 54  11/10/18 72  11/04/18 68     Past Medical History:  Diagnosis Date  . Anxiety   . History of cardiac murmur   . History of chicken pox   . History of gastroesophageal reflux (GERD)   . History of hay fever   . History of thyroid disease   . Hx of dysplastic nevus 2016   multiple sites   . SVT (supraventricular tachycardia) (HCC)    as a teen  . Thyroid nodule    left      Past Surgical History:  Procedure Laterality Date  . CARDIAC ELECTROPHYSIOLOGY STUDY AND ABLATION    . TONSILLECTOMY AND ADENOIDECTOMY    . WISDOM TOOTH EXTRACTION        Family History  Problem Relation Age of Onset  . Heart attack Maternal Grandfather   . Arthritis Maternal Grandmother   . Dementia Maternal Grandmother   . Stroke Paternal Grandmother   . Diabetes Father   . Skin cancer Paternal Grandfather   . Graves' disease Mother       Social History   Substance and  Sexual Activity  Drug Use No  ,   Social History   Substance and Sexual Activity  Alcohol Use Yes  . Alcohol/week: 5.0 standard drinks  . Types: 5 Standard drinks or equivalent per week   Comment: socially-weekends  ,   Social History   Tobacco Use  Smoking Status Former Smoker  . Packs/day: 0.25  . Quit date: 2014  . Years since quitting: 6.8  Smokeless Tobacco Never Used  Tobacco Comment   socially only  ,   Social History   Substance and Sexual Activity  Sexual Activity Yes  . Birth control/protection: None  Current Outpatient Medications on File Prior to Visit  Medication Sig Dispense Refill  . azelastine (ASTELIN) 0.1 % nasal spray Place 1 spray into both nostrils 2 (two) times daily. 1 spray each nostril twice daily after sinus rinses 30 mL 11  . EPINEPHrine (EPIPEN IJ) Inject as directed. As needed    . famotidine (PEPCID) 20 MG tablet Take 1 tablet (20 mg total) by mouth 2 (two) times daily. 180 tablet 0  . fluticasone (FLONASE) 50 MCG/ACT nasal spray 1 spray each nostril following sinus rinses twice daily 16 g 2  . levocetirizine (XYZAL) 5 MG tablet Take 1 tablet (5 mg total) by mouth every evening. 90 tablet 0  . pantoprazole (PROTONIX) 40 MG tablet Take 1 tablet (40 mg total) by mouth 2 (two) times daily. 60 tablet 3  . [DISCONTINUED] FLUoxetine (PROZAC) 20 MG tablet 1 po qd 90 tablet 1   No current facility-administered medications on file prior to visit.     Allergies: Bee venom  Review of Systems: General:   Denies fever, chills, unexplained weight loss.  Optho/Auditory:   Denies visual changes, blurred vision/LOV Respiratory:   Denies SOB, DOE more than baseline levels.   Cardiovascular:   Denies chest pain, palpitations, new onset peripheral edema  Gastrointestinal:   Denies nausea, vomiting, diarrhea.  Genitourinary: Denies dysuria, freq/ urgency, flank pain or discharge from genitals.  Endocrine:     Denies hot or cold intolerance, polyuria,  polydipsia. Musculoskeletal:   Denies unexplained myalgias, joint swelling, unexplained arthralgias, gait problems.  Skin:  Denies rash, suspicious lesions Neurological:     Denies dizziness, unexplained weakness, numbness  Psychiatric/Behavioral:   Denies mood changes, suicidal or homicidal ideations, hallucinations    Objective:    Blood pressure 121/83, pulse (!) 54, temperature 97.7 F (36.5 C), resp. rate 12, height 5\' 5"  (1.651 m), weight 161 lb 8 oz (73.3 kg), last menstrual period 10/28/2018, SpO2 100 %. Body mass index is 26.88 kg/m. General Appearance:    Alert, cooperative, no distress, appears stated age  Head:    Normocephalic, without obvious abnormality, atraumatic  Eyes:    PERRL, conjunctiva/corneas clear, EOM's intact, fundi    benign, both eyes  Ears:    Normal TM's and external ear canals, both ears  Nose:   Nares normal, septum midline, mucosa normal, no drainage    or sinus tenderness  Throat:   Lips w/o lesion, mucosa moist, and tongue normal; teeth and   gums normal  Neck:   Supple, symmetrical, trachea midline, no adenopathy;    thyroid:  no enlargement/tenderness/nodules; no carotid   bruit or JVD  Back:     Symmetric, no curvature, ROM normal, no CVA tenderness  Lungs:     Clear to auscultation bilaterally, respirations unlabored, no       Wh/ R/ R  Chest Wall:    No tenderness or gross deformity; normal excursion   Heart:    Regular rate and rhythm, S1 and S2 normal, no murmur, rub   or gallop  Breast Exam:    Deferred to OBGYN.  Abdomen:     Soft, non-tender, bowel sounds active all four quadrants, NO   G/R/R, no masses, no organomegaly  Genitalia:    Deferred to OBGYN.   Rectal:    Deferred to OBGYN.  Extremities:   Extremities normal, atraumatic, no cyanosis or gross edema  Pulses:   2+ and symmetric all extremities  Skin:   Warm, dry, Skin color, texture, turgor normal, no  obvious rashes or lesions Psych: No HI/SI, judgement and insight good,  Euthymic mood. Full Affect.  Neurologic:   CNII-XII intact, normal strength, sensation and reflexes    Throughout

## 2018-11-14 LAB — CBC WITH DIFFERENTIAL/PLATELET
Basophils Absolute: 0 10*3/uL (ref 0.0–0.2)
Basos: 0 %
EOS (ABSOLUTE): 0.1 10*3/uL (ref 0.0–0.4)
Eos: 2 %
Hematocrit: 40.7 % (ref 34.0–46.6)
Hemoglobin: 13.9 g/dL (ref 11.1–15.9)
Immature Grans (Abs): 0 10*3/uL (ref 0.0–0.1)
Immature Granulocytes: 0 %
Lymphocytes Absolute: 1.3 10*3/uL (ref 0.7–3.1)
Lymphs: 27 %
MCH: 29.2 pg (ref 26.6–33.0)
MCHC: 34.2 g/dL (ref 31.5–35.7)
MCV: 86 fL (ref 79–97)
Monocytes Absolute: 0.4 10*3/uL (ref 0.1–0.9)
Monocytes: 9 %
Neutrophils Absolute: 3.1 10*3/uL (ref 1.4–7.0)
Neutrophils: 62 %
Platelets: 221 10*3/uL (ref 150–450)
RBC: 4.76 x10E6/uL (ref 3.77–5.28)
RDW: 11.7 % (ref 11.7–15.4)
WBC: 5 10*3/uL (ref 3.4–10.8)

## 2018-11-14 LAB — COMPREHENSIVE METABOLIC PANEL
ALT: 35 IU/L — ABNORMAL HIGH (ref 0–32)
AST: 27 IU/L (ref 0–40)
Albumin/Globulin Ratio: 1.8 (ref 1.2–2.2)
Albumin: 4.6 g/dL (ref 3.8–4.8)
Alkaline Phosphatase: 47 IU/L (ref 39–117)
BUN/Creatinine Ratio: 21 (ref 9–23)
BUN: 16 mg/dL (ref 6–20)
Bilirubin Total: 0.7 mg/dL (ref 0.0–1.2)
CO2: 24 mmol/L (ref 20–29)
Calcium: 9.8 mg/dL (ref 8.7–10.2)
Chloride: 101 mmol/L (ref 96–106)
Creatinine, Ser: 0.75 mg/dL (ref 0.57–1.00)
GFR calc Af Amer: 117 mL/min/{1.73_m2} (ref >59)
GFR calc non Af Amer: 101 mL/min/{1.73_m2} (ref >59)
Globulin, Total: 2.5 g/dL (ref 1.5–4.5)
Glucose: 93 mg/dL (ref 65–99)
Potassium: 4.2 mmol/L (ref 3.5–5.2)
Sodium: 139 mmol/L (ref 134–144)
Total Protein: 7.1 g/dL (ref 6.0–8.5)

## 2018-11-14 LAB — LIPID PANEL
Chol/HDL Ratio: 3.8 ratio (ref 0.0–4.4)
Cholesterol, Total: 226 mg/dL — ABNORMAL HIGH (ref 100–199)
HDL: 59 mg/dL (ref >39)
LDL Chol Calc (NIH): 141 mg/dL — ABNORMAL HIGH (ref 0–99)
Triglycerides: 145 mg/dL (ref 0–149)
VLDL Cholesterol Cal: 26 mg/dL (ref 5–40)

## 2018-11-14 LAB — HEMOGLOBIN A1C
Est. average glucose Bld gHb Est-mCnc: 100 mg/dL
Hgb A1c MFr Bld: 5.1 % (ref 4.8–5.6)

## 2018-11-14 LAB — TSH: TSH: 1.37 u[IU]/mL (ref 0.450–4.500)

## 2018-11-14 LAB — T3: T3, Total: 118 ng/dL (ref 71–180)

## 2018-11-14 LAB — T4, FREE: Free T4: 0.99 ng/dL (ref 0.82–1.77)

## 2018-11-14 LAB — VITAMIN D 25 HYDROXY (VIT D DEFICIENCY, FRACTURES): Vit D, 25-Hydroxy: 22.3 ng/mL — ABNORMAL LOW (ref 30.0–100.0)

## 2018-12-22 ENCOUNTER — Ambulatory Visit: Payer: BC Managed Care – PPO | Admitting: Family Medicine

## 2018-12-22 ENCOUNTER — Other Ambulatory Visit: Payer: Self-pay

## 2018-12-22 ENCOUNTER — Encounter: Payer: Self-pay | Admitting: Family Medicine

## 2018-12-22 VITALS — BP 127/78 | HR 73 | Temp 98.5°F | Resp 10 | Ht 65.0 in | Wt 159.7 lb

## 2018-12-22 DIAGNOSIS — Z3201 Encounter for pregnancy test, result positive: Secondary | ICD-10-CM | POA: Diagnosis not present

## 2018-12-22 DIAGNOSIS — K6289 Other specified diseases of anus and rectum: Secondary | ICD-10-CM | POA: Diagnosis not present

## 2018-12-22 DIAGNOSIS — R221 Localized swelling, mass and lump, neck: Secondary | ICD-10-CM

## 2018-12-22 DIAGNOSIS — R12 Heartburn: Secondary | ICD-10-CM

## 2018-12-22 DIAGNOSIS — F4322 Adjustment disorder with anxiety: Secondary | ICD-10-CM

## 2018-12-22 LAB — POCT URINE PREGNANCY: Preg Test, Ur: POSITIVE — AB

## 2018-12-22 MED ORDER — VITAPEARL 30-1.4-200 MG PO CPCR: ORAL_CAPSULE | ORAL | 3 refills | Status: AC

## 2018-12-22 NOTE — Progress Notes (Signed)
Impression and Recommendations:    1. Perirectal discomfort   2. Positive pregnancy test   3. Adjustment reaction with anxiety   4. sensation of R sided Lump in neck/throat   5. Heartburn     Positive Pregnancy Test - EDD 09/03/2019; LMP was 11/27/2018. - Told patient to take no medications beside PNV until she checks with OBGYN. -  UPT in office today was also positive. - Told patient to review her full prescription list thoroughly with OBGYN. -  Patient understands to continue prescriptions only as okayed by OBGYN.  - Prenatal vitamin prescribed today per patient request.  See med list.  - OBGYN will continue to monitor patient.   Sensation of R-sided Lump in neck/throat; Submandibular Lymphadenopathy; Heartburn - Patient last went to see Dr. Thornton Park of GI on 11/10/2018. - Prescribed pantoprazole twice daily for 3 months.  See med list. - GI discussed with pt that EGD will be considered if sx not improved on current regimen in 6-8 weeks.  - Per patient, pantoprazole has "never worked" to control her symptoms. - Pepcid works to control patient's symptoms. - Patient will defer to OBGYN for which GI medicines are safe for patient to take i.e. Pepcid or not  - Per patient, next ENT visit is in two weeks. - Told patient of importance of obtaining future follow-up and care through ENT and GI as recommended.  - Told patient to continue to avoid smoking, drinking, caffeine, both for pregnancy and GERD.  - Will continue to monitor.   Perirectal Discomfort - Extensively reviewed patient's symptoms today. - Education provided and all questions answered. -Advised patient that besides a little hairline insult to tissues consistent with 2 mm in length fissure of superficial skin, there is no abnormality appreciated - Told patient to avoid touching the area in question. - Do not wipe or "pop" the area in question.  - If symptoms worsen or pustule develops, patient  knows to return for evaluation when the abnormality has emerged.  If symptoms worsen, patient knows that it may require referral to GI and possible scope to ensure there is no fistula etc.    - To relieve symptoms, encouraged patient to soak area in warm, salty water. - Advised Sitz baths, 15 minutes, 3-4 times per day.  - Use a wet wipe on the area if needed to avoid wiping hard in the area.  - Will continue to monitor.   Lifestyle & Preventative Health Maintenance - Advised patient to continue working toward exercising to improve overall mental, physical, and emotional health.    - Reviewed the "spokes of the wheel" of mood and health management.  Stressed the importance of ongoing prudent habits, including regular exercise, appropriate sleep hygiene, healthful dietary habits, and prayer/meditation to relax.  - Encouraged patient to engage in daily physical activity, especially a formal exercise routine.  Recommended that the patient eventually strive for at least 150 minutes of moderate cardiovascular activity per week according to guidelines established by the [facility].   - Healthy dietary habits encouraged, including low-carb, and high amounts of lean protein in diet.   - Patient should also consume adequate amounts of water.  - Health counseling performed.  All questions answered.   Orders Placed This Encounter  Procedures  . POCT urine pregnancy    Meds ordered this encounter  Medications  . Prenat-FeFum-Fered-FA-DHA w/oA (VITAPEARL) 30-1.4-200 MG CPCR    Sig: 1 po qd    Dispense:  90  capsule    Refill:  3    Expresses verbal understanding and consents to current therapy and treatment regimen.  No barriers to understanding were identified.  Red flag symptoms and signs discussed in detail.  Patient expressed understanding regarding what to do in case of emergency\urgent symptoms  Please see AVS handed out to patient at the end of our visit for further patient instructions/  counseling done pertaining to today's office visit.   Return for 15months or sooner if issues arise; f/up GI if sx Worsen.     Note:  This note was prepared with assistance of Dragon voice recognition software. Occasional wrong-word or sound-a-like substitutions may have occurred due to the inherent limitations of voice recognition software.   This document serves as a record of services personally performed by Thomasene Lot, DO. It was created on her behalf by Peggye Fothergill, a trained medical scribe. The creation of this record is based on the scribe's personal observations and the provider's statements to them.   This case required medical decision making of at least moderate complexity. The above documentation has been reviewed to be accurate and was completed by Carlye Grippe, D.O.      --------------------------------------------------------------------------------------------------------------------------------------------------------------------------------------------------------------------------------------------    Subjective:     HPI: Donna Contreras is a 38 y.o. female who presents to [facility] today for issues as discussed below.  - Pregnancy Notes she's pregnant.  Just discovered today thru home UPT.    Notes her last period was a month ago and she has been actively trying.  States planning to call her OBGYN today to deliver the news.  Has been with her new boyfriend for 7 months; he is excited about the news.  Says "he's amazing and so good."  - GI Symptoms Says she stopped taking the pantoprazole because it "doesn't work."  Notes "that stuff has never worked."  She continues taking Pepcid twice daily, which does control her symptoms and provide relief.  Her symptoms are well-controlled on Pepcid.  Notes cancelled her ultrasound because she started feeling better.  - Nodule in Perirectal Region Says "It hurts every  now and then, but then it doesn't."  Says it started years ago.  Confirms the area is like a pimple near the perirectal region.  Her ex popped the pimple; "it feels like it will sometimes fill up, and then go away."  If it feels full or hurting, "I just leave it alone and it goes away."  Denies symptoms currently.  "Sometimes it gets bigger, and then it goes down."  - Mood Management She is not currently taking Zoloft.  - Seasonal Allergies Has not been using the Flonase.  Is currently taking Xyzal daily.    Wt Readings from Last 3 Encounters:  12/22/18 159 lb 11.2 oz (72.4 kg)  11/13/18 161 lb 8 oz (73.3 kg)  11/10/18 163 lb (73.9 kg)   BP Readings from Last 3 Encounters:  12/22/18 127/78  11/13/18 121/83  11/10/18 108/80   Pulse Readings from Last 3 Encounters:  12/22/18 73  11/13/18 (!) 54  11/10/18 72   BMI Readings from Last 3 Encounters:  12/22/18 26.58 kg/m  11/13/18 26.88 kg/m  11/10/18 26.92 kg/m     Patient Care Team    Relationship Specialty Notifications Start End  Thomasene Lot, DO PCP - General Family Medicine  04/11/17      Patient Active Problem List   Diagnosis Date Noted  . SVT (supraventricular tachycardia) (HCC)  11/30/2013    Priority: High  . Marital disruption involving divorce- her spouse cheated on her. 06/25/2017    Priority: Medium  . High risk heterosexual behavior-  husband cheated on her. 06/25/2017    Priority: Medium  . GERD (gastroesophageal reflux disease) 11/30/2013    Priority: Medium  . 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 h/o Vitamin D deficiency 04/16/2017    Priority: Low  . Environmental and seasonal allergies 04/16/2017    Priority: Low  . H/O bee sting allergy 11/30/2013    Priority: Low  . Positive pregnancy test 12/22/2018  . Perirectal discomfort 12/22/2018  . sensation of R sided Lump in neck/throat 10/16/2018  . Family history of Graves' disease 10/16/2018   . PND (post-nasal drip) 10/16/2018  . Adjustment reaction with anxiety 10/16/2018  . Laryngopharyngeal reflux (LPR) 08/28/2018  . Perennial allergic rhinitis 08/28/2018  . Acute recurrent pansinusitis 08/20/2018  . Heartburn 08/20/2018  . Multiple closed fractures of ribs of both sides 11/26/2017  . Vaginal itching 03/08/2014  . Left thyroid nodule 11/30/2013  . Seasonal allergic rhinitis 11/30/2013  . Submandibular lymphadenopathy 11/30/2013    Past Medical history, Surgical history, Family history, Social history, Allergies and Medications have been entered into the medical record, reviewed and changed as needed.    Current Meds  Medication Sig  . EPINEPHrine (EPIPEN IJ) Inject as directed. As needed  . famotidine (PEPCID) 20 MG tablet Take 1 tablet (20 mg total) by mouth 2 (two) times daily.  . fluticasone (FLONASE) 50 MCG/ACT nasal spray 1 spray each nostril following sinus rinses twice daily  . levocetirizine (XYZAL) 5 MG tablet Take 1 tablet (5 mg total) by mouth every evening.  . pantoprazole (PROTONIX) 40 MG tablet Take 1 tablet (40 mg total) by mouth 2 (two) times daily.    Allergies:  Allergies  Allergen Reactions  . Bee Venom Anaphylaxis     Review of Systems:  A fourteen system review of systems was performed and found to be positive as per HPI.   Objective:   Blood pressure 127/78, pulse 73, temperature 98.5 F (36.9 C), temperature source Oral, resp. rate 10, height 5\' 5"  (1.651 m), weight 159 lb 11.2 oz (72.4 kg), last menstrual period 11/21/2018, SpO2 98 %. Body mass index is 26.58 kg/m. General:  Well Developed, well nourished, appropriate for stated age.  Neuro:  Alert and oriented,  extra-ocular muscles intact  HEENT:  Normocephalic, atraumatic, neck supple Skin:  no gross rash, warm, pink. Respiratory:   Not using accessory muscles, speaking in full sentences- unlabored. Vascular:  Ext warm, no cyanosis apprec.; cap RF less 2 sec. Psych:  No HI/SI,  judgement and insight good, Euthymic mood. Full Affect. Rectal: Above the 12 o'clock position of the anus, tiny laceration/fissure in the superficial tissues, < 2mm.  Rectal exam-internal: Normal tone, no masses or tenderness; no internal tissue abnormality appreciated; guaiac negative stool.

## 2018-12-22 NOTE — Patient Instructions (Signed)
Please do not take any medicines unless this is approved through your OB/GYN at this time.  Please pick up your prenatal vitamins and start them.   Pregnancy and the Partner's Role  You have an important role during your partner's pregnancy, labor, and delivery. To be helpful and supportive during this time, you should know what to expect. What are the stages of pregnancy? Pregnancy usually lasts for about 40 weeks. The pregnancy period is divided into 3 trimesters. First trimester (0-13 weeks) During this trimester, your partner may:  Feel tired.  Have painful breasts.  Feel nauseous and vomit.  Urinate more often.  Have mood changes. All of these changes are normal. Try to be helpful, supportive, and understanding. This may include helping with household chores and spending more time with each other. Second trimester (14-28 weeks) During this trimester:  Your partner may feel better and more energetic. This is the best time to be more active together.  You will be able to see her belly showing the pregnancy.  You may be able to feel the baby kick.  Your partner may have soreness or aching in her back as she gains weight. You can help by carrying heavy things and by rubbing her back when she is feeling sore. Third trimester (29-40 weeks) During the final weeks, your partner may:  Become more uncomfortable as the baby grows.  Have a hard time doing everyday activities, and her balance may be off.  Have a hard time bending over.  Get tired easily.  Have difficulty sleeping. At this time, the birth of your baby is close. You and your partner may have concerns or questions. This is normal. Talk with each other and with your health care provider. Continue to help your partner with household chores, encourage her to rest, and rub her sore back and legs, if this helps her. How to support your partner during pregnancy There are many things you can do to support your partner  during her pregnancy. Emotional support During your partner's pregnancy, you and your partner may:  Feel happy, excited, or proud.  Have concerns about finances or other new responsibilities.  Feel overwhelmed or scared.  Worry about changes in your relationship with your partner. These feelings are normal. Talk about them openly with your partner, trusted family members, other new parents, or your health care provider. Find ways to relax and manage stress together, such as taking a walk or listening to music. Prenatal care Attend prenatal care visits with your partner. This is a good time for you to get to know your health care provider, follow the pregnancy, and ask questions. You may have prenatal visits:  Once a month for the first 6 months.  Once every 2 weeks from 6-8 months.  Once a week during the last month.  You may have more prenatal visits if your health care provider believes that they are needed. Sex and intimacy It's important to stay connected with your partner during pregnancy, since your relationship may experience changes once baby arrives. Sexual intercourse is safe unless there is a problem with the pregnancy and your health care provider advises you not to have sex. Your partner may not want to have sex during certain times due to pregnancy-related physical and emotional changes. To continue to be intimate:  Discuss your feelings and desires.  Try different positions to make sex more comfortable.  Find other ways to be intimate, such as massage.  Talk with your health care provider  about any questions that you may have about sexual intercourse during pregnancy. Childbirth classes Attend childbirth classes with your partner if you are able. Classes help you and your partner bond by preparing for labor and delivery. Classes teach you:  What happens during labor and delivery.  How to emotionally and physically support your partner.  Relaxation techniques.   How to work through your partner's labor pains.  How to focus during labor and delivery. Follow these instructions at home:  Talk with your partner about her preferences for labor and delivery. If you plan to be present during labor and delivery, talk about: ? Her pain management preferences. You can help manage pain with massage and positioning your partner during labor. You can also help advocate for her preferences. ? How the baby will be delivered. Depending on your partner's health and pregnancy history, she may be able to attempt a vaginal delivery or may need to schedule a cesarean delivery, or C-section. ? If you would like to help with the delivery or cut your baby's umbilical cord.  Find ways to support your partner's health during pregnancy: ? Eat a healthy, well-balanced diet with your partner. ? Exercise with your partner. Exercising regularly during pregnancy can boost your partner's mood, reduce pain and swelling, and may even help prepare your partner to give birth. ? Make sure you are up to date on all recommended vaccines like the annual flu shot. ? Do not smoke or use e-cigarettes around your partner. When your partner breathes in smoke, the harmful substances that are inhaled can pass to your baby through the placenta. Summary  To be helpful and supportive during your partner's pregnancy, you can provide emotional support, find ways to be intimate with your partner, and attend prenatal care visits and childbirth classes.  Attending prenatal care visits with your partner helps you get to know your health care provider, follow the pregnancy, and ask questions.  It is important to talk with your partner and your health care provider if you are worried or have any questions.  Talk with your partner about her preferences for labor and delivery. Be prepared to be an advocate for her. This information is not intended to replace advice given to you by your health care provider.  Make sure you discuss any questions you have with your health care provider. Document Released: 06/27/2007 Document Revised: 05/02/2018 Document Reviewed: 01/21/2017 Elsevier Patient Education  2020 [employer].    Pregnancy After Age 25 Women who become pregnant after the age of 89 have a higher risk for certain problems during pregnancy. This is because older women may already have health problems before becoming pregnant. Older women who are healthy before pregnancy may still develop problems during pregnancy. These problems may affect the mother, the unborn baby (fetus), or both. What are the risks for me? If you are over age 18 and you want to become pregnant or are pregnant, you may have a higher risk of:  Not being able to get pregnant (infertility).  Going into labor early (preterm labor).  Needing surgical delivery of your baby (cesarean delivery, or C-section).  Having high blood pressure (hypertension).  Having complications during pregnancy, such as high blood pressure and other symptoms (preeclampsia).  Having diabetes during pregnancy (gestational diabetes).  Being pregnant with more than one baby.  Loss of the unborn baby before 20 weeks (miscarriage) or after 20 weeks of pregnancy (stillbirth). What are the risks for my baby? Babies born to women over  the age of 38 have a higher risk for:  Being born early (prematurity).  Low birth weight, which is less than 5 lb, 8 oz (2.5 kg).  Birth defects, such as Down syndrome and cleft palate.  Health complications, including problems with growth and development. How is prenatal care different for women over age 38? All women should see their health care provider before they try to become pregnant. This is especially important for women over the age of 635. Tell your health care provider about:  Any health problems you have.  Any medicines you take.  Any family history of health problems or chromosome-related  defects.  Any problems you have had with past pregnancies or deliveries. If you are over age 38 and you plan to become pregnant:  Start taking a daily multivitamin a month or more before you try to get pregnant. Your multivitamin should contain 400 mcg (micrograms) of folic acid. If you are over age 38 and pregnant, make sure you:  Keep taking your multivitamin unless your health care provider tells you not to take it.  Keep all prenatal visits as told by your health care provider. This is important.  Have ultrasounds regularly throughout your pregnancy to check for problems.  Talk with your health care provider about other prenatal screening tests that you may need. What additional prenatal tests are needed? Screening tests show whether your baby has a higher risk for birth defects than other babies. Screening tests include:  Ultrasound tests to look for markers that indicate a risk for birth defects.  Maternal blood screening. These are blood tests that measure certain substances in your blood to determine your baby's risk for defects. Screening tests do not show whether your baby has or does not have defects. They only show your baby's risk for certain defects. If your screening tests show that risk factors are present, you may need tests to confirm the defect (diagnostic testing). These tests may include:  Chorionic villus sampling. For this procedure, a tissue sample is taken from the organ that forms in your uterus to nourish your baby (placenta). The sample is removed through your cervix or abdomen and tested.  Amniocentesis. For this procedure, a small amount of the fluid that surrounds the baby in the uterus (amniotic fluid) is removed and tested. What can I do to stay healthy during my pregnancy? Staying healthy during pregnancy can help you and your baby to have a lower risk for problems during pregnancy, during delivery, or both. Talk with your health care provider for specific  instructions about staying healthy during your pregnancy. Nutrition   At each meal, eat a variety of foods from each of the five food groups. These groups include: ? Proteins such as lean meats, poultry, fish that is low in fat, beans, eggs, and nuts. ? Vegetables such as leafy greens, raw and cooked vegetables, and vegetable juice. ? Fruits that are fresh, frozen, or canned, or 100% fruit juice. ? Dairy products such as low-fat yogurt, cheese, and milk. ? Whole grains including rice, cereal, pasta, and bread.  Talk with your health care provider about how much food in each group is right for you.  Follow instructions from your health care provider about eating and drinking restrictions during pregnancy. ? Do not eat raw eggs, raw meat, or raw fish or seafood. ? Do not eat any fish that contains high amounts of mercury, such as swordfish or mackerel.  Drink 6-8 or more glasses of water a day.  You should drink enough fluid to keep your urine pale yellow. Managing weight gain  Ask your health care provider how much weight gain is healthy during pregnancy.  Stay at a healthy weight. If needed, work with your health care provider to lose weight safely. Activity  Exercise regularly, as directed by your health care provider. Ask your health care provider what forms of exercise are safe for you. General instructions  Do not use any products that contain nicotine or tobacco, such as cigarettes and e-cigarettes. If you need help quitting, ask your health care provider.  Do not drink alcohol, use drugs, or abuse prescription medicine.  Take over-the-counter and prescription medicines only as told by your health care provider.  Do not use hot tubs, steam rooms, or saunas.  Talk with your health care provider about your risk of exposure to harmful environmental conditions. This includes exposure to chemicals, radiation, cleaning products, and cat feces. Follow advice from your health care  provider about how to limit your exposure. Summary  Women who become pregnant after the age of 41 have a higher risk for complications during pregnancy.  Problems may affect the mother, the unborn baby (fetus), or both.  All women should see their health care provider before they try to become pregnant. This is especially important for women over the age of 68.  Staying healthy during pregnancy can help both you and your baby to have a lower risk for some of the problems that can happen during pregnancy, during delivery, or both. This information is not intended to replace advice given to you by your health care provider. Make sure you discuss any questions you have with your health care provider. Document Released: 04/30/2016 Document Revised: 05/02/2018 Document Reviewed: 04/30/2016 Elsevier Patient Education  2020 [employer].

## 2019-01-05 ENCOUNTER — Telehealth: Payer: Self-pay | Admitting: Family Medicine

## 2019-01-05 ENCOUNTER — Other Ambulatory Visit: Payer: Self-pay | Admitting: Family Medicine

## 2019-01-05 DIAGNOSIS — J3089 Other allergic rhinitis: Secondary | ICD-10-CM

## 2019-01-05 NOTE — Telephone Encounter (Signed)
Patient advised to call OBGYN to be sure Xyzal ok to take during pregnancy. Patient verbalized understanding. AS, CMA

## 2019-03-04 ENCOUNTER — Telehealth: Payer: Self-pay | Admitting: Family Medicine

## 2019-03-04 NOTE — Telephone Encounter (Signed)
  In terms of OB/GYN putting her on Dexilant for reflux, if they feel this is better for pregnant women, please let her know that all medication changes etc. when a patient is pregnant should come through her OB/GYN.  Please let her know that basically, they will be the the ones who have the "last word "on all of her medications while she is pregnant and even into the postpartum period.   I am sure they have told her that reflux can get much worse as her abdominal girth increases with pregnancy-this is common and tell her I would follow any recommendations they would have for her during this time.  Please let her know that I appreciate her reaching out to me and updating me on the information.   Please tell her I hope the pregnancy is going well and that she is feeling great.

## 2019-03-04 NOTE — Telephone Encounter (Signed)
Patient called states she wishes to speak w/ provider (Dr.Opalski about some new med another provider has suggested she take.)  --Forwarding message to med asst to contact pt or have Dr. Val Eagle contact pt @ [phone].  -glh

## 2019-03-04 NOTE — Telephone Encounter (Signed)
Left message for patient to call back. AS, CMA 

## 2019-03-04 NOTE — Telephone Encounter (Signed)
Patient called wanting to know about long term use of this medication. Patient states her OBGYN put her on Dexilant for GERD while pregnant but patient is asking if this is ok to continue taking after pregnancy.   Also, patient states that OGBYN states that if GERD doesn't improve with med then they suggest scope and said that OBGYN would do referral.

## 2019-03-05 NOTE — Telephone Encounter (Signed)
Patient is aware of the below and verbalized understanding. AS, CMA 

## 2019-03-30 ENCOUNTER — Other Ambulatory Visit: Payer: Self-pay | Admitting: Family Medicine

## 2019-03-30 DIAGNOSIS — J3089 Other allergic rhinitis: Secondary | ICD-10-CM

## 2019-04-05 NOTE — Progress Notes (Signed)
Cardiology Office Note:    Date:  04/07/2019   ID:  Donna Contreras, DOB 02-Aug-1980, MRN [id number]  PCP:  Mellody Dance, DO  Cardiologist:  No primary care provider on file.  Electrophysiologist:  None   Referring MD: Thurnell Lose, MD   Chief Complaint  Patient presents with  . Leg Swelling    History of Present Illness:    Donna Contreras is a 39 y.o. female with a hx of SVT status post ablation who is referred by Dr. Simona Huh for evaluation of lower extremity edema..  She is [redacted] weeks pregnant.   Reports she had an SVT ablation done at [facility] clinic at age 48.  No recurrence of SVT since that time.  Does report she gets intermittent palpitations, but occurs rarely and and feels like heart is fluttering, symptoms are not as significant as prior SVT.  She reports that prior to pregnancy she had noticed some swelling in her ankles.  She had been taking ibuprofen for leg pain that she would get from walking.  She has been walking 5 to 10 miles daily, no exertional chest pain or dyspnea.  She stopped taking ibuprofen and swelling resolved.  However when she became pregnant she developed significant lower extremity edema.  Also has been feeling shortness of breath.  She does report that she has been having some chest pain, but it improves with increasing her Dexilant.  She was referred by her obstetrician for further evaluation.  She smoked up to half a pack per day, smoked for less than 10 years.  No history of heart disease in immediate family, maternal grandfather had MI.   Past Medical History:  Diagnosis Date  . Anxiety   . History of cardiac murmur   . History of chicken pox   . History of gastroesophageal reflux (GERD)   . History of hay fever   . History of thyroid disease   . Hx of dysplastic nevus 2016   multiple sites   . SVT (supraventricular tachycardia) ([place])    as a teen  . Thyroid nodule    left    Past Surgical History:  Procedure Laterality Date  . CARDIAC  ELECTROPHYSIOLOGY STUDY AND ABLATION    . TONSILLECTOMY AND ADENOIDECTOMY    . WISDOM TOOTH EXTRACTION      Current Medications: Current Meds  Medication Sig  . azelastine (ASTELIN) 0.1 % nasal spray Place 1 spray into both nostrils 2 (two) times daily. 1 spray each nostril twice daily after sinus rinses  . fluticasone (FLONASE) 50 MCG/ACT nasal spray 1 spray each nostril following sinus rinses twice daily  . levocetirizine (XYZAL) 5 MG tablet TAKE 1 TABLET BY MOUTH EVERY EVENING.  . Prenat-FeFum-Fered-FA-DHA w/oA (VITAPEARL) 30-1.4-200 MG CPCR 1 po qd     Allergies:   Bee venom   Social History   Socioeconomic History  . Marital status: Married    Spouse name: Not on file  . Number of children: 0  . Years of education: Not on file  . Highest education level: Not on file  Occupational History  . Occupation: Pharmacist, hospital  Tobacco Use  . Smoking status: Former Smoker    Packs/day: 0.25    Quit date: 2014    Years since quitting: 7.2  . Smokeless tobacco: Never Used  . Tobacco comment: socially only  Substance and Sexual Activity  . Alcohol use: Yes    Alcohol/week: 5.0 standard drinks    Types: 5 Standard drinks or equivalent per week  Comment: socially-weekends  . Drug use: No  . Sexual activity: Yes    Birth control/protection: None  Other Topics Concern  . Not on file  Social History Narrative  . Not on file   Social Determinants of Health   Financial Resource Strain:   . Difficulty of Paying Living Expenses:   Food Insecurity:   . Worried About Programme researcher, broadcasting/film/video in the Last Year:   . Barista in the Last Year:   Transportation Needs:   . Freight forwarder (Medical):   Marland Kitchen Lack of Transportation (Non-Medical):   Physical Activity:   . Days of Exercise per Week:   . Minutes of Exercise per Session:   Stress:   . Feeling of Stress :   Social Connections:   . Frequency of Communication with Friends and Family:   . Frequency of Social Gatherings  with Friends and Family:   . Attends Religious Services:   . Active Member of Clubs or Organizations:   . Attends Banker Meetings:   Marland Kitchen Marital Status:      Family History: The patient's family history includes Arthritis in her maternal grandmother; Dementia in her maternal grandmother; Diabetes in her father; Luiz Blare' disease in her mother; Heart attack in her maternal grandfather; Skin cancer in her paternal grandfather; Stroke in her paternal grandmother.  ROS:   Please see the history of present illness.     All other systems reviewed and are negative.  EKGs/Labs/Other Studies Reviewed:    The following studies were reviewed today:   EKG:  EKG is  ordered today.  The ekg ordered today demonstrates normal sinus rhythm with PACs, no ST/T abnormalities  Recent Labs: 11/13/2018: ALT 35; BUN 16; Creatinine, Ser 0.75; Hemoglobin 13.9; Platelets 221; Potassium 4.2; Sodium 139; TSH 1.370  Recent Lipid Panel    Component Value Date/Time   CHOL 226 (H) 11/13/2018 1143   TRIG 145 11/13/2018 1143   HDL 59 11/13/2018 1143   CHOLHDL 3.8 11/13/2018 1143   LDLCALC 141 (H) 11/13/2018 1143    Physical Exam:    VS:  BP 100/62 (BP Location: Right Arm, Patient Position: Sitting, Cuff Size: Normal)   Pulse 77   Temp 98.2 F (36.8 C)   Ht 5\' 5"  (1.651 m)   Wt 164 lb (74.4 kg)   SpO2 99%   BMI 27.29 kg/m     Wt Readings from Last 3 Encounters:  04/07/19 164 lb (74.4 kg)  12/22/18 159 lb 11.2 oz (72.4 kg)  11/13/18 161 lb 8 oz (73.3 kg)     GEN:  in no acute distress HEENT: Normal NECK: No JVD LYMPHATICS: No lymphadenopathy  CARDIAC: RRR, no murmurs, rubs, gallops RESPIRATORY:  Clear to auscultation without rales, wheezing or rhonchi  ABDOMEN: Soft, non-tender, non-distended MUSCULOSKELETAL:  1+ BLE edema; No deformity  SKIN: Warm and dry NEUROLOGIC:  Alert and oriented x 3 PSYCHIATRIC:  Normal affect   ASSESSMENT:    1. Shortness of breath   2. Bilateral leg  edema   3. Palpitations   4. SVT (supraventricular tachycardia) (HCC)    PLAN:     Lower extremity edema/dyspna: Suspect related to pregnancy, but did report some lower extremity edema prior to becoming pregnant.  Will check TTE to rule out structural heart disease.  Will check lower extremity duplex to rule out DVT.  SVT: Status post ablation 20 years ago.  No evidence of recurrence.  Reports rare palpitations.  Can consider cardiac  monitor if occurring more frequently.  RTC in 3 months   Medication Adjustments/Labs and Tests Ordered: Current medicines are reviewed at length with the patient today.  Concerns regarding medicines are outlined above.  Orders Placed This Encounter  Procedures  . EKG 12-Lead  . ECHOCARDIOGRAM COMPLETE  . VAS [place] LOWER EXTREMITY VENOUS (DVT)   No orders of the defined types were placed in this encounter.   Patient Instructions  Medication Instructions:  Your physician recommends that you continue on your current medications as directed. Please refer to the Current Medication list given to you today.  Lab Work: NONE  Testing/Procedures: Your physician has requested that you have an echocardiogram. Echocardiography is a painless test that uses sound waves to create images of your heart. It provides your doctor with information about the size and shape of your heart and how well your heart's chambers and valves are working. This procedure takes approximately one hour. There are no restrictions for this procedure. This will be done at our [facility] location:  [address]  Your physician has requested that you have a lower extremity venous duplex. This test is an ultrasound of the veins in the legs. It looks at venous blood flow that carries blood from the heart to the legs. Allow one hour for a Lower Venous exam. There are no restrictions or special instructions.   Follow-Up: At [facility], you and your health needs are our  priority.  As part of our continuing mission to provide you with exceptional heart care, we have created designated Provider Care Teams.  These Care Teams include your primary Cardiologist (physician) and Advanced Practice Providers (APPs -  Physician Assistants and Nurse Practitioners) who all work together to provide you with the care you need, when you need it.  We recommend signing up for the patient portal called "MyChart".  Sign up information is provided on this After Visit Summary.  MyChart is used to connect with patients for Virtual Visits (Telemedicine).  Patients are able to view lab/test results, encounter notes, upcoming appointments, etc.  Non-urgent messages can be sent to your provider as well.   To learn more about what you can do with MyChart, go to [email or web address].    Your next appointment:   3 month(s)  The format for your next appointment:   In Person  Provider:   Epifanio Lesches, MD       Signed, Little Ishikawa, MD  04/07/2019 5:00 PM    [facility]

## 2019-04-07 ENCOUNTER — Encounter: Payer: Self-pay | Admitting: Cardiology

## 2019-04-07 ENCOUNTER — Ambulatory Visit: Payer: BC Managed Care – PPO | Admitting: Cardiology

## 2019-04-07 ENCOUNTER — Other Ambulatory Visit: Payer: Self-pay

## 2019-04-07 VITALS — BP 100/62 | HR 77 | Temp 98.2°F | Ht 65.0 in | Wt 164.0 lb

## 2019-04-07 DIAGNOSIS — R002 Palpitations: Secondary | ICD-10-CM

## 2019-04-07 DIAGNOSIS — I471 Supraventricular tachycardia: Secondary | ICD-10-CM | POA: Diagnosis not present

## 2019-04-07 DIAGNOSIS — R600 Localized edema: Secondary | ICD-10-CM | POA: Diagnosis not present

## 2019-04-07 DIAGNOSIS — R0602 Shortness of breath: Secondary | ICD-10-CM

## 2019-04-07 NOTE — Patient Instructions (Signed)
Medication Instructions:  Your physician recommends that you continue on your current medications as directed. Please refer to the Current Medication list given to you today.  Lab Work: NONE  Testing/Procedures: Your physician has requested that you have an echocardiogram. Echocardiography is a painless test that uses sound waves to create images of your heart. It provides your doctor with information about the size and shape of your heart and how well your heart's chambers and valves are working. This procedure takes approximately one hour. There are no restrictions for this procedure. This will be done at our [facility] location:  [address]  Your physician has requested that you have a lower extremity venous duplex. This test is an ultrasound of the veins in the legs. It looks at venous blood flow that carries blood from the heart to the legs. Allow one hour for a Lower Venous exam. There are no restrictions or special instructions.   Follow-Up: At [facility], you and your health needs are our priority.  As part of our continuing mission to provide you with exceptional heart care, we have created designated Provider Care Teams.  These Care Teams include your primary Cardiologist (physician) and Advanced Practice Providers (APPs -  Physician Assistants and Nurse Practitioners) who all work together to provide you with the care you need, when you need it.  We recommend signing up for the patient portal called "MyChart".  Sign up information is provided on this After Visit Summary.  MyChart is used to connect with patients for Virtual Visits (Telemedicine).  Patients are able to view lab/test results, encounter notes, upcoming appointments, etc.  Non-urgent messages can be sent to your provider as well.   To learn more about what you can do with MyChart, go to [email or web address].    Your next appointment:   3 month(s)  The format for your next appointment:   In  Person  Provider:   Epifanio Lesches, MD

## 2019-04-14 ENCOUNTER — Other Ambulatory Visit: Payer: Self-pay

## 2019-04-14 ENCOUNTER — Ambulatory Visit (HOSPITAL_COMMUNITY)
Admission: RE | Admit: 2019-04-14 | Discharge: 2019-04-14 | Disposition: A | Discharge status: Home / Self Care | Payer: BC Managed Care – PPO | Source: Ambulatory Visit | Attending: Cardiovascular Disease | Admitting: Cardiovascular Disease

## 2019-04-14 DIAGNOSIS — R600 Localized edema: Secondary | ICD-10-CM | POA: Insufficient documentation

## 2019-04-21 ENCOUNTER — Ambulatory Visit (INDEPENDENT_AMBULATORY_CARE_PROVIDER_SITE_OTHER): Payer: BC Managed Care – PPO | Admitting: Family Medicine

## 2019-04-21 ENCOUNTER — Other Ambulatory Visit: Payer: Self-pay

## 2019-04-21 ENCOUNTER — Encounter: Payer: Self-pay | Admitting: Family Medicine

## 2019-04-21 VITALS — BP 98/63 | HR 75 | Temp 98.4°F | Ht 65.0 in | Wt 165.0 lb

## 2019-04-21 DIAGNOSIS — O99612 Diseases of the digestive system complicating pregnancy, second trimester: Secondary | ICD-10-CM | POA: Diagnosis not present

## 2019-04-21 DIAGNOSIS — K6289 Other specified diseases of anus and rectum: Secondary | ICD-10-CM

## 2019-04-21 DIAGNOSIS — F4322 Adjustment disorder with anxiety: Secondary | ICD-10-CM | POA: Diagnosis not present

## 2019-04-21 DIAGNOSIS — Z872 Personal history of diseases of the skin and subcutaneous tissue: Secondary | ICD-10-CM | POA: Diagnosis not present

## 2019-04-21 DIAGNOSIS — J3089 Other allergic rhinitis: Secondary | ICD-10-CM

## 2019-04-21 DIAGNOSIS — R12 Heartburn: Secondary | ICD-10-CM

## 2019-04-21 DIAGNOSIS — J0141 Acute recurrent pansinusitis: Secondary | ICD-10-CM

## 2019-04-21 DIAGNOSIS — Z3492 Encounter for supervision of normal pregnancy, unspecified, second trimester: Secondary | ICD-10-CM

## 2019-04-21 NOTE — Progress Notes (Signed)
Impression and Recommendations:    1. Pregnant and not yet delivered in second trimester   2. Perirectal discomfort   3. H/O sebaceous cyst   4. Adjustment reaction with anxiety   5. Environmental and seasonal allergies   6. Acute recurrent pansinusitis   7. Heartburn      - Reviewed patient's medications, and patient has checked with all of her specialists that these are fine for her to take during pregnancy.   Health Habits during Pregnancy - Second Trimester - Encouraged patient to continue to follow a prudent lifestyle during her pregnancy. Wt gain per OB however, pt did not know range, advised usually 15-20lbs is appropriate  - Advised patient to consume a prudent diet, including plenty of fruits, veggies, healthy foods, nutrients to help with the growth of the baby. Cont pre-natal vit  - Advised patient to exercise 30+ minutes per day.  - Encouraged patient to drink plenty of water and remain hydrated. ( 1/2 wt in oz water/day )  - Will continue to monitor.   Mild Perirectal Discomfort - h/o sebaceous cyst, multiple on scalp and other places - symptoms are very minimal ( not bothersome) and the patient declines referral to dermatology for further assessment of whether or not this is a sebaceous cyst versus other.  She has several seb cysts on her body- scalp and other places.  Doubtful this is fistula etc.  - To help treat symptoms, advised her to apply a warm washcloth to the area, and engage in sitz baths for relief.  In general, advised patient to work on increasing blood flow to the area.  - Advised patient to avoid straining while passing a bowel movement etc.  - Encouraged patient to use an antimicrobial soap, such as Dial, to clean just the rectal area  - If the area gets bigger, discussed referral to Dermatology for further treatment and full resolution of the area if desired.  - Will continue to monitor.   Heartburn - Stable at this time on Dexilant.  - Continue treatment plan as established.  See med list. - Management provided per OBGYN during patient's pregnancy. - Will continue to monitor.   Adjustment reaction with anxiety - Stable at this time. No concerns and appears to be doing very well - Continue management as established. - Will continue to monitor.   Environmental & Seasonal Allergies, Acute Recurrent Pansinusitis - Encouraged patient to come in from exposure to allergens, wash her face, and flush with normal saline sinus rinses.  - Patient knows she may begin using un-medicated AYR or Neilmed sinus rinses BID.   Please see AVS handed out to patient at the end of our visit for further patient instructions/ counseling done pertaining to today's office visit.   Return for  n/a- pt moving out of the area into [place].  Est. with providers out there.     Note:  This note was prepared with assistance of Dragon voice recognition software. Occasional wrong-word or sound-a-like substitutions may have occurred due to the inherent limitations of voice recognition software.   The [place] was signed into law in 2016 which includes the topic of electronic health records.  This provides immediate access to information in MyChart.  This includes consultation notes, operative notes, office notes, lab results and pathology reports.  If you have any questions about what you read please let us know at your next visit or call us at the office.  We are  right here with you.   This case required medical decision making of at least moderate complexity.  This document serves as a record of services personally performed by Thomasene Lot, DO. It was created on her behalf by Peggye Fothergill, a trained medical scribe. The creation of this record is based on the scribe's personal observations and the provider's statements to them.    The above documentation from Peggye Fothergill, medical scribe, has been reviewed by Carlye Grippe, D.O.       --------------------------------------------------------------------------------------------------------------------------------------------------------------------------------------------------------------------------------------------    Subjective:     Donna Contreras, am serving as scribe for Dr.Chris Cripps.   HPI: Donna Contreras is a 39 y.o. female who presents to [facility] today for issues as discussed below.   - Pregnancy, relationship with boyfriend Patient is planning to move out of the area into [place] this weekend, and notes her house closes at the end of April.  She is moving into a house her boyfriend is renting, and notes he sold his house and his divorce is final as well.  Her boyfriend is a Proofreader, and as-is, states he builds an hour and a half past where they will be living.  Her parents "love him" and are very happy about the situation.  For her pregnancy, she is following up with Dr. Dion Body of OBGYN.  She goes next week for follow-up, and returns to her OBGYN for check-ups every month.  The patient knows that she is having a daughter and has a name picked out.  She continues taking her prenatal vitamins.  Thinks that during her pregnancy, she has gained about 5 lbs from her regular weight.  Notes meat was making her sick, so she avoided eating that, and actually lost some weight initially.  Thinks she's eating normally now, about three meals per day.  She has an upcoming echocardiogram scheduled due to some bilateral swelling in her legs.  Says "they scanned my legs and there were no blood clots, but they want me to do an echocardiogram."  Denies SOB while she lies flat.    - Stress Says "I really don't have the stress that I have."  Believes this is mainly due to her new relationship in general.  Indicates that it's "really amazing," being with a different person.   - GERD She is currently taking  Dexilant for her acid reflux, as recommended by her OBGYN.  Notes it's currently working.   - Perirectal Discomfort Says "that thing on my butt is hurting," and she remembered pus recently coming out of the area.  Says about a week ago, it "popped, because it was full," and now it's shrunk back down.  States that the area is annoying and hurts sometimes.  Believes the area hurts when it's ready to express itself.  Confirms that the area feels hard and may be a sebaceous cyst.  Says sometimes the area itches and "it's got a hard spot on it right now," and states "it doesn't ever close."  Notes she develops cysts on her head as well, and states "they're gross."    Depression screen [facility] 2/9 04/21/2019 12/22/2018 11/13/2018  Decreased Interest 0 0 0  Down, Depressed, Hopeless 0 0 0  PHQ - 2 Score 0 0 0  Altered sleeping 0 0 0  Tired, decreased energy 0 0 0  Change in appetite 0 0 0  Feeling bad or failure about yourself  0 0 0  Trouble concentrating 0 0  0  Moving slowly or fidgety/restless 0 0 0  Suicidal thoughts 0 0 0  PHQ-9 Score 0 0 0  Difficult doing work/chores - - Not difficult at all  Some recent data might be hidden   GAD 7 : Generalized Anxiety Score 08/20/2018  Nervous, Anxious, on Edge 0  Control/stop worrying 0  Worry too much - different things 0  Trouble relaxing 0  Restless 0  Easily annoyed or irritable 0  Afraid - awful might happen 0  Total GAD 7 Score 0  Anxiety Difficulty Not difficult at all      Wt Readings from Last 3 Encounters:  04/21/19 165 lb (74.8 kg)  04/07/19 164 lb (74.4 kg)  12/22/18 159 lb 11.2 oz (72.4 kg)   BP Readings from Last 3 Encounters:  04/21/19 98/63  04/07/19 100/62  12/22/18 127/78   Pulse Readings from Last 3 Encounters:  04/21/19 75  04/07/19 77  12/22/18 73   BMI Readings from Last 3 Encounters:  04/21/19 27.46 kg/m  04/07/19 27.29 kg/m  12/22/18 26.58 kg/m     Patient Care Team    Relationship Specialty  Notifications Start End  Thomasene Lot, DO PCP - General Family Medicine  04/11/17      Patient Active Problem List   Diagnosis Date Noted  . SVT (supraventricular tachycardia) (HCC) 11/30/2013  . Marital disruption involving divorce- her spouse cheated on her. 06/25/2017  . High risk heterosexual behavior-  husband cheated on her. 06/25/2017  . GERD (gastroesophageal reflux disease) 11/30/2013  . 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 h/o Vitamin D deficiency 04/16/2017  . Environmental and seasonal allergies 04/16/2017  . H/O bee sting allergy 11/30/2013  . H/O sebaceous cyst 04/21/2019  . Positive pregnancy test 12/22/2018  . Perirectal discomfort 12/22/2018  . sensation of R sided Lump in neck/throat 10/16/2018  . Family history of Graves' disease 10/16/2018  . PND (post-nasal drip) 10/16/2018  . Adjustment reaction with anxiety 10/16/2018  . Laryngopharyngeal reflux (LPR) 08/28/2018  . Perennial allergic rhinitis 08/28/2018  . Acute recurrent pansinusitis 08/20/2018  . Heartburn 08/20/2018  . Multiple closed fractures of ribs of both sides 11/26/2017  . Vaginal itching 03/08/2014  . Left thyroid nodule 11/30/2013  . Seasonal allergic rhinitis 11/30/2013  . Submandibular lymphadenopathy 11/30/2013    Past Medical history, Surgical history, Family history, Social history, Allergies and Medications have been entered into the medical record, reviewed and changed as needed.    Current Meds  Medication Sig  . azelastine (ASTELIN) 0.1 % nasal spray Place 1 spray into both nostrils 2 (two) times daily. 1 spray each nostril twice daily after sinus rinses  . DEXILANT 30 MG capsule Take 1 capsule by mouth daily as needed.  . fluticasone (FLONASE) 50 MCG/ACT nasal spray 1 spray each nostril following sinus rinses twice daily  . levocetirizine (XYZAL) 5 MG tablet TAKE 1 TABLET BY MOUTH EVERY EVENING.  . Prenat-FeFum-Fered-FA-DHA w/oA (VITAPEARL)  30-1.4-200 MG CPCR 1 po qd    Allergies:  Allergies  Allergen Reactions  . Bee Venom Anaphylaxis     Review of Systems:  A fourteen system review of systems was performed and found to be positive as per HPI.   Objective:   Blood pressure 98/63, pulse 75, temperature 98.4 F (36.9 C), temperature source Oral, height 5\' 5"  (1.651 m), weight 165 lb (74.8 kg), last menstrual period 11/27/2018, SpO2 98 %. Body mass index is 27.46 kg/m. General:  Well Developed, well  nourished, appropriate for stated age.  Neuro:  Alert and oriented,  extra-ocular muscles intact  HEENT:  Normocephalic, atraumatic, neck supple, no carotid bruits appreciated  Skin:  at the 12 o'clock position of her rectal folds, a very tiny opening / break in the skin with a subcutaneous small nodule about the size of 1.5 mm, which is freely mobile.  No pus, no redness, no inflammation or signs of infection.  No gross rash, warm, pink. Cardiac:  RRR, S1 S2 Respiratory:  ECTA B/L and A/P, Not using accessory muscles, speaking in full sentences- unlabored. Vascular:  Ext warm, no cyanosis apprec.; cap RF less 2 sec. Psych:  No HI/SI, judgement and insight good, Euthymic mood. Full Affect.

## 2019-04-29 ENCOUNTER — Other Ambulatory Visit: Payer: Self-pay

## 2019-04-29 ENCOUNTER — Ambulatory Visit (HOSPITAL_COMMUNITY): Payer: BC Managed Care – PPO | Attending: Cardiology

## 2019-04-29 DIAGNOSIS — R0602 Shortness of breath: Secondary | ICD-10-CM | POA: Insufficient documentation

## 2019-05-05 ENCOUNTER — Other Ambulatory Visit: Payer: Self-pay | Admitting: *Deleted

## 2019-05-05 DIAGNOSIS — I071 Rheumatic tricuspid insufficiency: Secondary | ICD-10-CM

## 2019-05-27 ENCOUNTER — Telehealth: Payer: Self-pay | Admitting: Physician Assistant

## 2019-05-27 NOTE — Telephone Encounter (Signed)
Patient is currently [redacted] weeks pregnant.   I advised patient to contact her OBGYN for medication at this time due to her pregnancy. Patient verbalized understanding and states she will call them. AS, CMA

## 2019-05-27 NOTE — Telephone Encounter (Signed)
Patient called request refill on :  levocetirizine (XYZAL) 5 MG tablet [[id number]]   Order Details Dose, Route, Frequency: As Directed  Dispense Quantity: 90 tablet Refills: 0       Sig: TAKE 1 TABLET BY MOUTH EVERY EVENING.       ---Forwarding request to send to New Pharmacy :   [facility] [phone] [place], [place] - [address] [phone] (Phone) [phone] (Fax)   -- if any questions concerns contact pt at [phone]  --glh

## 2019-07-13 ENCOUNTER — Ambulatory Visit: Payer: BC Managed Care – PPO | Admitting: Cardiology

## 2019-11-23 ENCOUNTER — Telehealth: Payer: Self-pay | Admitting: Cardiology

## 2019-11-23 NOTE — Telephone Encounter (Signed)
Pt is s/p c-section delivery 3 months ago. Pt resumed her period about 2 weeks ago and states she started getting "dizzy spells" around that time. Denies syncope. States she has intermittent chest discomfort that she characterizes as a "dull pain, like a sore muscle" but also states pain can be "burning." States this pain originates between her breasts but spreads to her right right ribs, lower right back, right shoulder blade. Pain worsens with deep inspiration and worsens with arm movement. Pain worsens as they day goes on. Pain occurs daily, but "comes and goes" throughout the day. Denies shortness of breath. HR 65, O2 98% per home pulse ox today.  Pt presented to Urgent Care last week and had EKG and CXR performed at that time. GI cocktail administered at Urgent Care mildly improved pt's symptoms. Provider added carafate at that visit, but pt states she felt "horrible, tired, dizzy" on this medication so she stopped taking it. Per pt, Urgent Care provider spoke to pt about potential need for further imaging such as CT but no orders were placed at that visit.  Counseled pt to go be evaluated at ER for chest pain or shortness of breath. Pt is reticent to go to ER at this time and plans to reach out to the Urgent Care where she was last seen and inquire about getting orders for outpatient imaging. Pt states she will go to ER if she is unable to obtain new orders through Urgent Care. Pt is making active plans to establish with a primary care provider and states she will keep upcoming GI appt. Pt states she will notify our office of any further complaints.

## 2019-11-23 NOTE — Telephone Encounter (Signed)
Pt c/o of Chest Pain: STAT if CP now or developed within 24 hours  1. Are you having CP right now? No  2. Are you experiencing any other symptoms (ex. SOB, nausea, vomiting, sweating)? No  3. How long have you been experiencing CP? 2 weeks  4. Is your CP continuous or coming and going? Coming and going  5. Have you taken Nitroglycerin? No  ?

## 2019-11-27 ENCOUNTER — Ambulatory Visit: Payer: BC Managed Care – PPO | Admitting: Family Medicine

## 2020-02-01 ENCOUNTER — Encounter: Payer: BC Managed Care – PPO | Admitting: Dermatology

## 2020-02-11 ENCOUNTER — Encounter: Payer: BC Managed Care – PPO | Admitting: Dermatology

## 2020-07-08 ENCOUNTER — Telehealth: Payer: Self-pay | Admitting: Cardiology

## 2020-07-08 NOTE — Telephone Encounter (Signed)
Spoke to patient, she is requesting to reschedule her echocardiogram at the [facility] location-due to hospital charge.   She is also requesting to speak to someone in billing/precert in regards to test and charges.     She has moved to [facility] and [place] is a lot closer to her than [place].  She would like to follow up there if possible.    Advised would get echo rescheduled (cancelled appt 6/21 for echo in [place]), will have someone in precert/billing contact her to help with her preauth/charge questions and also will arrange for follow up in [facility] due to move.  Patient aware and verbalized understanding.

## 2020-07-08 NOTE — Telephone Encounter (Signed)
Patient states she spoke with her insurance and the billing department and was told she will be charged a hospital bill and a bill from the office, because the office uses hospital equipment. She would like to know if there is another location she can have the echo done at that does not use hospital equipment. She states she also moved to [facility] and would like to know if she can be referred to a doctor there.

## 2020-07-12 ENCOUNTER — Other Ambulatory Visit (HOSPITAL_COMMUNITY): Payer: BC Managed Care – PPO

## 2020-08-02 ENCOUNTER — Other Ambulatory Visit: Payer: Self-pay

## 2020-08-02 ENCOUNTER — Ambulatory Visit (INDEPENDENT_AMBULATORY_CARE_PROVIDER_SITE_OTHER): Payer: BC Managed Care – PPO

## 2020-08-02 DIAGNOSIS — I071 Rheumatic tricuspid insufficiency: Secondary | ICD-10-CM

## 2020-08-02 LAB — ECHOCARDIOGRAM COMPLETE
AR max vel: 3.08 cm2
AV Area VTI: 2.7 cm2
AV Area mean vel: 2.71 cm2
AV Mean grad: 3 mmHg
AV Peak grad: 6.4 mmHg
Ao pk vel: 1.26 m/s
Area-P 1/2: 3.76 cm2
MV VTI: 2.21 cm2
S' Lateral: 3.3 cm

## 2020-08-19 ENCOUNTER — Other Ambulatory Visit: Payer: Self-pay | Admitting: Endocrinology

## 2020-08-19 DIAGNOSIS — E041 Nontoxic single thyroid nodule: Secondary | ICD-10-CM

## 2020-09-13 ENCOUNTER — Telehealth: Payer: Self-pay | Admitting: Cardiology

## 2020-09-13 NOTE — Telephone Encounter (Signed)
Patient is requesting to switch from Dr. Bjorn Pippin to Dr. Okey Dupre due to location.

## 2020-09-15 NOTE — Telephone Encounter (Signed)
That is fine with me.  Gailyn Crook, MD [facility]  

## 2020-09-22 ENCOUNTER — Ambulatory Visit
Admission: RE | Admit: 2020-09-22 | Discharge: 2020-09-22 | Disposition: A | Discharge status: Home / Self Care | Payer: BC Managed Care – PPO | Source: Ambulatory Visit | Attending: Endocrinology | Admitting: Endocrinology

## 2020-09-22 ENCOUNTER — Other Ambulatory Visit (HOSPITAL_COMMUNITY)
Admission: RE | Admit: 2020-09-22 | Discharge: 2020-09-22 | Disposition: A | Discharge status: Home / Self Care | Payer: BC Managed Care – PPO | Source: Ambulatory Visit | Attending: Radiology | Admitting: Radiology

## 2020-09-22 ENCOUNTER — Other Ambulatory Visit: Payer: Self-pay

## 2020-09-22 DIAGNOSIS — E041 Nontoxic single thyroid nodule: Secondary | ICD-10-CM | POA: Diagnosis present

## 2020-09-23 LAB — CYTOLOGY - NON PAP

## 2021-04-10 ENCOUNTER — Encounter: Payer: Self-pay | Admitting: Physician Assistant
